# Patient Record
Sex: Female | Born: 1935 | Race: White | Hispanic: No | Smoking: Never smoker
Health system: Southern US, Community
[De-identification: ages and names within clinical notes are randomized; demographics above are authoritative.]

## PROBLEM LIST (undated history)

## (undated) DIAGNOSIS — I5032 Chronic diastolic (congestive) heart failure: Secondary | ICD-10-CM

## (undated) DIAGNOSIS — I4819 Other persistent atrial fibrillation: Principal | ICD-10-CM

## (undated) DIAGNOSIS — F329 Major depressive disorder, single episode, unspecified: Secondary | ICD-10-CM

## (undated) DIAGNOSIS — I2510 Atherosclerotic heart disease of native coronary artery without angina pectoris: Secondary | ICD-10-CM

## (undated) DIAGNOSIS — I10 Essential (primary) hypertension: Secondary | ICD-10-CM

## (undated) DIAGNOSIS — I219 Acute myocardial infarction, unspecified: Secondary | ICD-10-CM

## (undated) DIAGNOSIS — Z8679 Personal history of other diseases of the circulatory system: Secondary | ICD-10-CM

## (undated) DIAGNOSIS — T4145XA Adverse effect of unspecified anesthetic, initial encounter: Secondary | ICD-10-CM

## (undated) DIAGNOSIS — F32A Depression, unspecified: Secondary | ICD-10-CM

## (undated) DIAGNOSIS — T8859XA Other complications of anesthesia, initial encounter: Secondary | ICD-10-CM

## (undated) DIAGNOSIS — E039 Hypothyroidism, unspecified: Secondary | ICD-10-CM

## (undated) HISTORY — DX: Acute myocardial infarction, unspecified: I21.9

## (undated) HISTORY — DX: Depression, unspecified: F32.A

## (undated) HISTORY — DX: Other complications of anesthesia, initial encounter: T88.59XA

## (undated) HISTORY — PX: FRACTURE SURGERY: SHX138

## (undated) HISTORY — PX: EYE SURGERY: SHX253

## (undated) HISTORY — DX: Major depressive disorder, single episode, unspecified: F32.9

## (undated) HISTORY — PX: INNER EAR SURGERY: SHX679

## (undated) HISTORY — DX: Chronic diastolic (congestive) heart failure: I50.32

## (undated) HISTORY — PX: HERNIA REPAIR: SHX51

## (undated) HISTORY — DX: Atherosclerotic heart disease of native coronary artery without angina pectoris: I25.10

## (undated) HISTORY — DX: Personal history of other diseases of the circulatory system: Z86.79

## (undated) HISTORY — DX: Other persistent atrial fibrillation: I48.19

## (undated) HISTORY — DX: Essential (primary) hypertension: I10

## (undated) HISTORY — DX: Adverse effect of unspecified anesthetic, initial encounter: T41.45XA

## (undated) HISTORY — DX: Hypothyroidism, unspecified: E03.9

---
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
## 1986-03-28 HISTORY — PX: TONSILLECTOMY: SUR1361

## 2004-05-26 ENCOUNTER — Ambulatory Visit (HOSPITAL_COMMUNITY): Admission: RE | Admit: 2004-05-26 | Discharge: 2004-05-26 | Payer: Self-pay | Admitting: Surgery

## 2004-05-26 ENCOUNTER — Ambulatory Visit (HOSPITAL_BASED_OUTPATIENT_CLINIC_OR_DEPARTMENT_OTHER): Admission: RE | Admit: 2004-05-26 | Discharge: 2004-05-26 | Payer: Self-pay | Admitting: Surgery

## 2011-01-28 ENCOUNTER — Other Ambulatory Visit (HOSPITAL_COMMUNITY): Payer: Self-pay | Admitting: Orthopedic Surgery

## 2011-01-28 ENCOUNTER — Ambulatory Visit (HOSPITAL_COMMUNITY)
Admission: RE | Admit: 2011-01-28 | Discharge: 2011-01-28 | Disposition: A | Discharge status: Home / Self Care | Payer: Medicare Other | Source: Ambulatory Visit | Attending: Orthopedic Surgery | Admitting: Orthopedic Surgery

## 2011-01-28 ENCOUNTER — Encounter (HOSPITAL_COMMUNITY): Payer: Medicare Other

## 2011-01-28 ENCOUNTER — Encounter (HOSPITAL_COMMUNITY): Payer: Self-pay

## 2011-01-28 DIAGNOSIS — Z01818 Encounter for other preprocedural examination: Secondary | ICD-10-CM

## 2011-01-28 DIAGNOSIS — M1610 Unilateral primary osteoarthritis, unspecified hip: Secondary | ICD-10-CM | POA: Insufficient documentation

## 2011-01-28 DIAGNOSIS — M169 Osteoarthritis of hip, unspecified: Secondary | ICD-10-CM | POA: Insufficient documentation

## 2011-01-28 LAB — SURGICAL PCR SCREEN
MRSA, PCR: NEGATIVE
Staphylococcus aureus: NEGATIVE

## 2011-01-28 LAB — COMPREHENSIVE METABOLIC PANEL
ALT: 15 U/L (ref 0–35)
AST: 19 U/L (ref 0–37)
Calcium: 10.4 mg/dL (ref 8.4–10.5)
Sodium: 139 mEq/L (ref 135–145)
Total Protein: 6.9 g/dL (ref 6.0–8.3)

## 2011-01-28 LAB — URINALYSIS, ROUTINE W REFLEX MICROSCOPIC
Glucose, UA: NEGATIVE mg/dL
Ketones, ur: NEGATIVE mg/dL
Protein, ur: NEGATIVE mg/dL

## 2011-01-28 LAB — CBC
Hemoglobin: 13.6 g/dL (ref 12.0–15.0)
MCHC: 32.9 g/dL (ref 30.0–36.0)
RBC: 4.49 MIL/uL (ref 3.87–5.11)
WBC: 9.5 10*3/uL (ref 4.0–10.5)

## 2011-01-28 LAB — URINE MICROSCOPIC-ADD ON

## 2011-01-28 LAB — ABO/RH: ABO/RH(D): AB POS

## 2011-02-01 ENCOUNTER — Other Ambulatory Visit: Payer: Self-pay | Admitting: Orthopedic Surgery

## 2011-02-01 MED ORDER — BUPIVACAINE 0.25 % ON-Q PUMP SINGLE CATH 300ML: 300.0000 mL | INJECTION | Status: DC

## 2011-02-01 MED ORDER — BUPIVACAINE 0.25 % ON-Q PUMP SINGLE CATH 300ML
300.0000 mL | INJECTION | Status: DC
  Filled 2011-02-01: qty 300
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
## 2011-02-02 ENCOUNTER — Encounter (HOSPITAL_COMMUNITY): Payer: Self-pay | Admitting: *Deleted

## 2011-02-02 ENCOUNTER — Inpatient Hospital Stay (HOSPITAL_COMMUNITY)
Admission: RE | Admit: 2011-02-02 | Discharge: 2011-02-07 | DRG: 470 | Disposition: A | Discharge status: Skilled Nursing Facility | Payer: Medicare Other | Source: Ambulatory Visit | Attending: Orthopedic Surgery | Admitting: Orthopedic Surgery

## 2011-02-02 ENCOUNTER — Encounter (HOSPITAL_COMMUNITY): Payer: Self-pay | Admitting: Orthopedic Surgery

## 2011-02-02 ENCOUNTER — Inpatient Hospital Stay (HOSPITAL_COMMUNITY): Payer: Medicare Other | Admitting: *Deleted

## 2011-02-02 ENCOUNTER — Encounter (HOSPITAL_COMMUNITY)
Admission: RE | Disposition: A | Discharge status: Skilled Nursing Facility | Payer: Self-pay | Source: Ambulatory Visit | Attending: Orthopedic Surgery

## 2011-02-02 ENCOUNTER — Inpatient Hospital Stay (HOSPITAL_COMMUNITY): Payer: Medicare Other

## 2011-02-02 DIAGNOSIS — I252 Old myocardial infarction: Secondary | ICD-10-CM

## 2011-02-02 DIAGNOSIS — I10 Essential (primary) hypertension: Secondary | ICD-10-CM | POA: Diagnosis present

## 2011-02-02 DIAGNOSIS — Z01812 Encounter for preprocedural laboratory examination: Secondary | ICD-10-CM

## 2011-02-02 DIAGNOSIS — D62 Acute posthemorrhagic anemia: Secondary | ICD-10-CM | POA: Diagnosis not present

## 2011-02-02 DIAGNOSIS — E663 Overweight: Secondary | ICD-10-CM | POA: Diagnosis present

## 2011-02-02 DIAGNOSIS — M899 Disorder of bone, unspecified: Secondary | ICD-10-CM | POA: Diagnosis present

## 2011-02-02 DIAGNOSIS — I2510 Atherosclerotic heart disease of native coronary artery without angina pectoris: Secondary | ICD-10-CM | POA: Diagnosis present

## 2011-02-02 DIAGNOSIS — F3289 Other specified depressive episodes: Secondary | ICD-10-CM | POA: Diagnosis present

## 2011-02-02 DIAGNOSIS — M169 Osteoarthritis of hip, unspecified: Secondary | ICD-10-CM

## 2011-02-02 DIAGNOSIS — F329 Major depressive disorder, single episode, unspecified: Secondary | ICD-10-CM | POA: Diagnosis present

## 2011-02-02 DIAGNOSIS — M1610 Unilateral primary osteoarthritis, unspecified hip: Principal | ICD-10-CM | POA: Diagnosis present

## 2011-02-02 DIAGNOSIS — K469 Unspecified abdominal hernia without obstruction or gangrene: Secondary | ICD-10-CM | POA: Diagnosis present

## 2011-02-02 DIAGNOSIS — M949 Disorder of cartilage, unspecified: Secondary | ICD-10-CM | POA: Diagnosis present

## 2011-02-02 DIAGNOSIS — E039 Hypothyroidism, unspecified: Secondary | ICD-10-CM | POA: Diagnosis present

## 2011-02-02 DIAGNOSIS — I8390 Asymptomatic varicose veins of unspecified lower extremity: Secondary | ICD-10-CM | POA: Diagnosis present

## 2011-02-02 DIAGNOSIS — Z79899 Other long term (current) drug therapy: Secondary | ICD-10-CM

## 2011-02-02 DIAGNOSIS — Z96649 Presence of unspecified artificial hip joint: Secondary | ICD-10-CM

## 2011-02-02 DIAGNOSIS — E876 Hypokalemia: Secondary | ICD-10-CM | POA: Diagnosis not present

## 2011-02-02 DIAGNOSIS — Z6835 Body mass index (BMI) 35.0-35.9, adult: Secondary | ICD-10-CM

## 2011-02-02 DIAGNOSIS — H9190 Unspecified hearing loss, unspecified ear: Secondary | ICD-10-CM | POA: Diagnosis present

## 2011-02-02 HISTORY — PX: TOTAL HIP ARTHROPLASTY: SHX124

## 2011-02-02 LAB — TYPE AND SCREEN
ABO/RH(D): AB POS
Antibody Screen: NEGATIVE

## 2011-02-02 SURGERY — ARTHROPLASTY, HIP, TOTAL,POSTERIOR APPROACH
Anesthesia: Spinal | Site: Hip | Laterality: Right | Wound class: Clean

## 2011-02-02 MED ORDER — SODIUM CHLORIDE 0.9 % IR SOLN
Status: DC | PRN
  Administered 2011-02-02: 1000 mL

## 2011-02-02 MED ORDER — MORPHINE SULFATE 2 MG/ML IJ SOLN
1.0000 mg | INTRAMUSCULAR | Status: DC | PRN
  Administered 2011-02-02 (×2): 1 mg via INTRAVENOUS
  Filled 2011-02-02: qty 1

## 2011-02-02 MED ORDER — CEFAZOLIN SODIUM 1-5 GM-% IV SOLN
1.0000 g | Freq: Four times a day (QID) | INTRAVENOUS | Status: AC
  Administered 2011-02-02 – 2011-02-03 (×3): 1 g via INTRAVENOUS
  Filled 2011-02-02 (×3): qty 50

## 2011-02-02 MED ORDER — RIVAROXABAN 10 MG PO TABS
10.0000 mg | ORAL_TABLET | ORAL | Status: DC
  Administered 2011-02-03 – 2011-02-07 (×6): 10 mg via ORAL
  Filled 2011-02-02 (×5): qty 1

## 2011-02-02 MED ORDER — METHOCARBAMOL 500 MG PO TABS
500.0000 mg | ORAL_TABLET | Freq: Four times a day (QID) | ORAL | Status: DC | PRN
Start: 2011-02-02 — End: 2011-02-07
  Administered 2011-02-03 – 2011-02-07 (×8): 500 mg via ORAL
  Filled 2011-02-02 (×8): qty 1

## 2011-02-02 MED ORDER — OLMESARTAN MEDOXOMIL 40 MG PO TABS
40.0000 mg | ORAL_TABLET | Freq: Every day | ORAL | Status: DC
  Administered 2011-02-03 – 2011-02-07 (×6): 40 mg via ORAL
  Filled 2011-02-02 (×5): qty 1

## 2011-02-02 MED ORDER — SODIUM CHLORIDE 0.9 % IJ SOLN
INTRAMUSCULAR | Status: DC | PRN
  Administered 2011-02-02: 50 mL

## 2011-02-02 MED ORDER — POLYETHYLENE GLYCOL 3350 17 G PO PACK
17.0000 g | PACK | Freq: Every day | ORAL | Status: DC | PRN
  Administered 2011-02-04: 17 g via ORAL
  Filled 2011-02-02: qty 1

## 2011-02-02 MED ORDER — MIDAZOLAM HCL 5 MG/5ML IJ SOLN
INTRAMUSCULAR | Status: DC | PRN
  Administered 2011-02-02: 2 mg via INTRAVENOUS

## 2011-02-02 MED ORDER — ONDANSETRON HCL 4 MG/2ML IJ SOLN: 4.0000 mg | Freq: Four times a day (QID) | INTRAMUSCULAR | Status: DC | PRN

## 2011-02-02 MED ORDER — HYDROMORPHONE HCL PF 1 MG/ML IJ SOLN: 0.2500 mg | INTRAMUSCULAR | Status: DC | PRN

## 2011-02-02 MED ORDER — FLEET ENEMA 7-19 GM/118ML RE ENEM: 1.0000 | ENEMA | Freq: Every day | RECTAL | Status: DC | PRN

## 2011-02-02 MED ORDER — ATENOLOL 50 MG PO TABS
50.0000 mg | ORAL_TABLET | Freq: Every day | ORAL | Status: DC
  Administered 2011-02-03 – 2011-02-07 (×6): 50 mg via ORAL
  Filled 2011-02-02 (×5): qty 1

## 2011-02-02 MED ORDER — PHENOL 1.4 % MT LIQD: 1.0000 | OROMUCOSAL | Status: DC | PRN

## 2011-02-02 MED ORDER — MAGNESIUM HYDROXIDE 400 MG/5ML PO SUSP: 30.0000 mL | Freq: Two times a day (BID) | ORAL | Status: DC | PRN

## 2011-02-02 MED ORDER — FENTANYL CITRATE 0.05 MG/ML IJ SOLN
INTRAMUSCULAR | Status: DC | PRN
  Administered 2011-02-02: 50 ug via INTRAVENOUS

## 2011-02-02 MED ORDER — ACETAMINOPHEN 325 MG PO TABS
650.0000 mg | ORAL_TABLET | Freq: Four times a day (QID) | ORAL | Status: DC | PRN
  Administered 2011-02-05 – 2011-02-07 (×4): 650 mg via ORAL
  Filled 2011-02-02 (×4): qty 2

## 2011-02-02 MED ORDER — TEMAZEPAM 15 MG PO CAPS: 15.0000 mg | ORAL_CAPSULE | Freq: Every evening | ORAL | Status: DC | PRN

## 2011-02-02 MED ORDER — MENTHOL 3 MG MT LOZG: 1.0000 | LOZENGE | OROMUCOSAL | Status: DC | PRN

## 2011-02-02 MED ORDER — BISACODYL 5 MG PO TBEC: 10.0000 mg | DELAYED_RELEASE_TABLET | Freq: Every day | ORAL | Status: DC | PRN

## 2011-02-02 MED ORDER — PROPOFOL 10 MG/ML IV EMUL
INTRAVENOUS | Status: DC | PRN
  Administered 2011-02-02: 25 ug/kg/min via INTRAVENOUS

## 2011-02-02 MED ORDER — METHOCARBAMOL 100 MG/ML IJ SOLN
500.0000 mg | Freq: Four times a day (QID) | INTRAVENOUS | Status: DC | PRN
  Administered 2011-02-02: 500 mg via INTRAVENOUS
  Filled 2011-02-02 (×2): qty 5

## 2011-02-02 MED ORDER — BUPIVACAINE LIPOSOME 1.3 % IJ SUSP
INTRAMUSCULAR | Status: DC | PRN
  Administered 2011-02-02: 20 mL

## 2011-02-02 MED ORDER — BUPIVACAINE HCL 0.75 % IJ SOLN
INTRAMUSCULAR | Status: DC | PRN
  Administered 2011-02-02: 1.6 mL

## 2011-02-02 MED ORDER — BUPIVACAINE LIPOSOME 1.3 % IJ SUSP
20.0000 mL | INTRAMUSCULAR | Status: DC
  Filled 2011-02-02 (×2): qty 20

## 2011-02-02 MED ORDER — ACETAMINOPHEN 10 MG/ML IV SOLN
INTRAVENOUS | Status: DC | PRN
  Administered 2011-02-02: 1000 mg via INTRAVENOUS

## 2011-02-02 MED ORDER — DIPHENHYDRAMINE HCL 12.5 MG/5ML PO ELIX: 12.5000 mg | ORAL_SOLUTION | ORAL | Status: DC | PRN

## 2011-02-02 MED ORDER — DULOXETINE HCL 60 MG PO CPEP
60.0000 mg | ORAL_CAPSULE | ORAL | Status: DC
Start: 2011-02-03 — End: 2011-02-07
  Administered 2011-02-03 – 2011-02-07 (×5): 60 mg via ORAL
  Filled 2011-02-02 (×5): qty 1

## 2011-02-02 MED ORDER — LEVOTHYROXINE SODIUM 50 MCG PO TABS
50.0000 ug | ORAL_TABLET | ORAL | Status: DC
  Administered 2011-02-03 – 2011-02-07 (×5): 50 ug via ORAL
  Filled 2011-02-02 (×6): qty 1

## 2011-02-02 MED ORDER — METOCLOPRAMIDE HCL 10 MG PO TABS: 5.0000 mg | ORAL_TABLET | Freq: Three times a day (TID) | ORAL | Status: DC | PRN

## 2011-02-02 MED ORDER — ACETAMINOPHEN 650 MG RE SUPP: 650.0000 mg | Freq: Four times a day (QID) | RECTAL | Status: DC | PRN

## 2011-02-02 MED ORDER — METOCLOPRAMIDE HCL 5 MG/ML IJ SOLN
5.0000 mg | Freq: Three times a day (TID) | INTRAMUSCULAR | Status: DC | PRN
  Filled 2011-02-02: qty 2

## 2011-02-02 MED ORDER — ACETAMINOPHEN 10 MG/ML IV SOLN
1000.0000 mg | Freq: Four times a day (QID) | INTRAVENOUS | Status: AC
  Administered 2011-02-02 – 2011-02-03 (×4): 1000 mg via INTRAVENOUS
  Filled 2011-02-02 (×5): qty 100

## 2011-02-02 MED ORDER — CEFAZOLIN SODIUM 1-5 GM-% IV SOLN: 2.0000 g | Freq: Once | INTRAVENOUS | Status: DC

## 2011-02-02 MED ORDER — ONDANSETRON HCL 4 MG PO TABS: 4.0000 mg | ORAL_TABLET | Freq: Four times a day (QID) | ORAL | Status: DC | PRN

## 2011-02-02 MED ORDER — EPHEDRINE SULFATE 50 MG/ML IJ SOLN
INTRAMUSCULAR | Status: DC | PRN
  Administered 2011-02-02 (×3): 5 mg via INTRAVENOUS
  Administered 2011-02-02: 10 mg via INTRAVENOUS
  Administered 2011-02-02: 5 mg via INTRAVENOUS

## 2011-02-02 MED ORDER — BISACODYL 10 MG RE SUPP: 10.0000 mg | Freq: Every day | RECTAL | Status: DC | PRN

## 2011-02-02 MED ORDER — KCL IN DEXTROSE-NACL 20-5-0.9 MEQ/L-%-% IV SOLN
INTRAVENOUS | Status: DC
  Administered 2011-02-02: 17:00:00 via INTRAVENOUS
  Administered 2011-02-03: 1000 mL via INTRAVENOUS
  Administered 2011-02-04: via INTRAVENOUS
  Filled 2011-02-02 (×6): qty 1000

## 2011-02-02 MED ORDER — CEFAZOLIN SODIUM 1-5 GM-% IV SOLN
INTRAVENOUS | Status: DC | PRN
  Administered 2011-02-02: 2 g via INTRAVENOUS

## 2011-02-02 MED ORDER — OXYCODONE HCL 5 MG PO TABS
5.0000 mg | ORAL_TABLET | ORAL | Status: DC | PRN
  Administered 2011-02-02 – 2011-02-04 (×5): 5 mg via ORAL
  Administered 2011-02-04 (×2): 10 mg via ORAL
  Administered 2011-02-04: 5 mg via ORAL
  Administered 2011-02-05: 10 mg via ORAL
  Administered 2011-02-06 – 2011-02-07 (×2): 5 mg via ORAL
  Filled 2011-02-02 (×2): qty 2
  Filled 2011-02-02 (×6): qty 1
  Filled 2011-02-02: qty 2
  Filled 2011-02-02 (×2): qty 1

## 2011-02-02 MED ORDER — LACTATED RINGERS IV SOLN
INTRAVENOUS | Status: DC
  Administered 2011-02-02: 12:00:00 via INTRAVENOUS
  Administered 2011-02-02: 1000 mL via INTRAVENOUS
  Administered 2011-02-02 (×4): via INTRAVENOUS

## 2011-02-02 MED ORDER — MORPHINE SULFATE 2 MG/ML IJ SOLN
INTRAMUSCULAR | Status: AC
  Administered 2011-02-02: 1 mg via INTRAVENOUS
  Filled 2011-02-02: qty 1

## 2011-02-02 MED ORDER — DOCUSATE SODIUM 100 MG PO CAPS
100.0000 mg | ORAL_CAPSULE | Freq: Two times a day (BID) | ORAL | Status: DC
  Administered 2011-02-02 – 2011-02-07 (×11): 100 mg via ORAL
  Filled 2011-02-02 (×11): qty 1

## 2011-02-02 SURGICAL SUPPLY — 74 items
BAG SPEC THK2 15X12 ZIP CLS (MISCELLANEOUS) ×2
BAG ZIPLOCK 12X15 (MISCELLANEOUS) ×4 IMPLANT
BALL HIP 32MM PLUS 3 (Hips) IMPLANT
BANDAGE ELASTIC 6 VELCRO ST LF (GAUZE/BANDAGES/DRESSINGS) ×1 IMPLANT
BANDAGE ESMARK 6X9 LF (GAUZE/BANDAGES/DRESSINGS) ×1 IMPLANT
BIT DRILL 2.8X128 (BIT) ×2 IMPLANT
BLADE EXTENDED COATED 6.5IN (ELECTRODE) ×2 IMPLANT
BLADE SAG 18X100X1.27 (BLADE) ×1 IMPLANT
BLADE SAW SAG 73X25 THK (BLADE) ×1
BLADE SAW SGTL 11.0X1.19X90.0M (BLADE) ×1 IMPLANT
BLADE SAW SGTL 73X25 THK (BLADE) ×1 IMPLANT
BNDG CMPR 9X6 STRL LF SNTH (GAUZE/BANDAGES/DRESSINGS)
BNDG ESMARK 6X9 LF (GAUZE/BANDAGES/DRESSINGS)
BOWL SMART MIX CTS (DISPOSABLE) ×1 IMPLANT
CATH KIT ON-Q SILVERSOAK 5 (CATHETERS) ×1 IMPLANT
CATH KIT ON-Q SILVERSOAK 5IN (CATHETERS) IMPLANT
CLOTH BEACON ORANGE TIMEOUT ST (SAFETY) ×3 IMPLANT
CUFF TOURN SGL QUICK 34 (TOURNIQUET CUFF)
CUFF TRNQT CYL 34X4X40X1 (TOURNIQUET CUFF) ×1 IMPLANT
CUP ACETBLR 52 OD PINNACLE (Hips) ×1 IMPLANT
DRAPE EXTREMITY T 121X128X90 (DRAPE) ×1 IMPLANT
DRAPE INCISE IOBAN 66X45 STRL (DRAPES) ×2 IMPLANT
DRAPE ORTHO SPLIT 77X108 STRL (DRAPES) ×4
DRAPE POUCH INSTRU U-SHP 10X18 (DRAPES) ×3 IMPLANT
DRAPE SURG ORHT 6 SPLT 77X108 (DRAPES) ×2 IMPLANT
DRAPE U-SHAPE 47X51 STRL (DRAPES) ×3 IMPLANT
DRESSING ALLEVYN BORDER HEEL (GAUZE/BANDAGES/DRESSINGS) ×1 IMPLANT
DRSG ADAPTIC 3X8 NADH LF (GAUZE/BANDAGES/DRESSINGS) ×2 IMPLANT
DRSG MEPILEX BORDER 4X4 (GAUZE/BANDAGES/DRESSINGS) ×3 IMPLANT
DURAPREP 26ML APPLICATOR (WOUND CARE) ×3 IMPLANT
ELECT REM PT RETURN 9FT ADLT (ELECTROSURGICAL) ×2
ELECTRODE REM PT RTRN 9FT ADLT (ELECTROSURGICAL) ×2 IMPLANT
ELIMINATOR HOLE APEX DEPUY (Hips) ×1 IMPLANT
EVACUATOR 1/8 PVC DRAIN (DRAIN) ×3 IMPLANT
FACESHIELD LNG OPTICON STERILE (SAFETY) ×13 IMPLANT
GLOVE BIO SURGEON STRL SZ7.5 (GLOVE) ×3 IMPLANT
GLOVE BIO SURGEON STRL SZ8 (GLOVE) ×3 IMPLANT
GLOVE BIOGEL PI IND STRL 8 (GLOVE) ×4 IMPLANT
GLOVE BIOGEL PI INDICATOR 8 (GLOVE) ×2
GOWN PREVENTION PLUS XLARGE (GOWN DISPOSABLE) ×2 IMPLANT
GOWN STRL REIN XL XLG (GOWN DISPOSABLE) ×3 IMPLANT
HANDPIECE INTERPULSE COAX TIP (DISPOSABLE)
HIP BALL 32MM PLUS 3 (Hips) ×2 IMPLANT
IMMOBILIZER KNEE 20 (SOFTGOODS) ×2
IMMOBILIZER KNEE 20 THIGH 36 (SOFTGOODS) ×1 IMPLANT
KIT BASIN OR (CUSTOM PROCEDURE TRAY) ×3 IMPLANT
LINER MARATHON NEUT +4X52X32 (Hips) ×1 IMPLANT
MANIFOLD NEPTUNE II (INSTRUMENTS) ×3 IMPLANT
NS IRRIG 1000ML POUR BTL (IV SOLUTION) ×3 IMPLANT
PACK TOTAL JOINT (CUSTOM PROCEDURE TRAY) ×3 IMPLANT
PAD ABD 7.5X8 STRL (GAUZE/BANDAGES/DRESSINGS) ×1 IMPLANT
PADDING CAST COTTON 6X4 STRL (CAST SUPPLIES) ×3 IMPLANT
PASSER SUT SWANSON 36MM LOOP (INSTRUMENTS) ×2 IMPLANT
POSITIONER SURGICAL ARM (MISCELLANEOUS) ×3 IMPLANT
SCREW 6.5MMX25MM (Screw) ×1 IMPLANT
SCREW 6.5MMX30MM (Screw) ×1 IMPLANT
SET HNDPC FAN SPRY TIP SCT (DISPOSABLE) ×1 IMPLANT
SLEEVE FEM PROX 20D SML (Hips) ×1 IMPLANT
SPONGE GAUZE 4X4 12PLY (GAUZE/BANDAGES/DRESSINGS) ×3 IMPLANT
SPONGE GAUZE 4X4 FOR O.R. (GAUZE/BANDAGES/DRESSINGS) ×1 IMPLANT
SROM FEM STEM (Hips) ×1 IMPLANT
STRIP CLOSURE SKIN 1/2X4 (GAUZE/BANDAGES/DRESSINGS) ×6 IMPLANT
SUCTION FRAZIER 12FR DISP (SUCTIONS) ×2 IMPLANT
SUT ETHIBOND NAB CT1 #1 30IN (SUTURE) ×4 IMPLANT
SUT MNCRL AB 4-0 PS2 18 (SUTURE) ×3 IMPLANT
SUT PDS AB 1 CT1 27 (SUTURE) ×3 IMPLANT
SUT VIC AB 1 CT1 27 (SUTURE) ×6
SUT VIC AB 1 CT1 27XBRD ANTBC (SUTURE) ×3 IMPLANT
SUT VIC AB 2-0 CT1 27 (SUTURE) ×6
SUT VIC AB 2-0 CT1 TAPERPNT 27 (SUTURE) ×6 IMPLANT
TOWEL OR 17X26 10 PK STRL BLUE (TOWEL DISPOSABLE) ×6 IMPLANT
TRAY FOLEY CATH 14FRSI W/METER (CATHETERS) ×3 IMPLANT
WATER STERILE IRR 1500ML POUR (IV SOLUTION) ×3 IMPLANT
WRAP KNEE MAXI GEL POST OP (GAUZE/BANDAGES/DRESSINGS) ×1 IMPLANT

## 2011-02-02 NOTE — Preoperative (Signed)
Beta Blockers   Reason not to administer Beta Blockers:Took atenolol 50 mg on 02/02/2011 at 06:30

## 2011-02-02 NOTE — Anesthesia Postprocedure Evaluation (Signed)
  Anesthesia Post-op Note  Patient: Amy Chapman  Procedure(s) Performed:  TOTAL HIP ARTHROPLASTY  Patient Location: PACU  Anesthesia Type: Spinal  Level of Consciousness: awake and alert   Airway and Oxygen Therapy: Patient Spontanous Breathing  Post-op Pain: mild  Post-op Assessment: Post-op Vital signs reviewed, Patient's Cardiovascular Status Stable, Respiratory Function Stable, Patent Airway and No signs of Nausea or vomiting  Post-op Vital Signs: stable  Complications: No apparent anesthesia complications;moving both feet

## 2011-02-02 NOTE — Op Note (Signed)
Pre-operative diagnosis- Osteoarthritis Right hip  Post-operative diagnosis- Osteoarthritis  Right hip  Procedure-  RightTotal Hip Arthroplasty  Surgeon- Gus Rankin. Logan Vegh, MD  Assistant- Avel Peace, PA-C   Anesthesia  Spinal  EBL- 150 ml   Drain- None   Complication- None  Condition- Stable to PACU  Brief Clinical Note-  Amy Chapman is a 77 y.o. female with end stage arthritis of her right hip with progressively worsening pain and dysfunction. Pain occurs with activity and rest including pain at night. She has tried analgesics, protected weight bearing and rest without benefit. Pain is too severe to attempt physical therapy. Radiographs demonstrate bone on bone arthritis with subchondral cyst formation.   Procedure in detail-   The patient is brought into the operating room and placed on the operating table. After successful administration of Spinal   anesthesia, the patient is placed in the  Left lateral decubitus position with the  Right side up and held in place with the hip positioner. The lower extremity is isolated from the perineum with plastic drapes and time-out is performed by the surgical team. The lower extremity is then prepped and draped in the usual sterile fashion. A short posterolateral incision is made with a ten blade through the subcutaneous tissue to the level of the fascia lata which is incised in line with the skin incision. The sciatic nerve is palpated and protected and the short external rotators and capsule are isolated from the femur. The hip is then dislocated and the center of the femoral head is marked. A trial prosthesis is placed such that the trial head corresponds to the center of the patients' native femoral head. The resection level is marked on the femoral neck and the resection is made with an oscillating saw. The femoral head is removed and femoral retractors placed to gain access to the femoral canal.      The canal finder is passed into the  femoral canal and the canal is thoroughly irrigated with sterile saline to remove the fatty contents. Axial reaming is performed to 15.5  mm, proximal reaming to 20D   and the sleeve machined to a large. A 20D large trial sleeve is placed into the proximal femur.      The femur is then retracted anteriorly to gain acetabular exposure. Acetabular retractors are placed and the labrum and osteophytes are removed, Acetabular reaming is performed to 51  mm and a 52  mm Pinnacle acetabular shell is placed in anatomic position with excellent purchase. Additional dome screws were placed. An apex hole eliminator is placed and the permanent 32 mm neutral plus4 Marathon liner is placed into the acetabular shell.      The trial femur is then placed into the femoral canal. The size is 20 x 15  stem with a 36 +8  neck and a 32 + 3 head with the neck version matching  the patients' native anteversion. The hip is reduced with excellent stability with full extension and full external rotation, 70 degrees flexion with 40 degrees adduction and 90 degrees internal rotation and 90 degrees of flexion with 70 degrees of internal rotation. The operative leg is placed on top of the non-operative leg and the leg lengths are found to be equal. The trials are then removed and the permanent implant of the same size is impacted into the femoral canal. The metal  femoral head of the same size as the trial is placed and the hip is reduced with the same  stability parameters. The operative leg is again placed on top of the non-operative leg and the leg lengths are found to be equal.      The wound is then copiously irrigated with saline solution and the capsule and short external rotators are re-attached to the femur through drill holes with Ethibond suture. The fascia lata is closed over a hemovac drain with #1 vicryl suture and the fascia lata, gluteal muscles and subcutaneous tissues are injected with Exparel 20ml diluted with saline 50ml. The  subcutaneous tissues are closed with #1 and2-0 vicryl and the subcuticular layer closed with running 4-0 Monocryl. The drain is hooked to suction, incision cleaned and dried, and steri-srips and a bulky sterile dressing applied. The limb is placed into a knee immobilizer and the patient is awakened and transported to recovery in stable condition.      Please note that a surgical assistant was a medical necessity for this procedure in order to perform it in a safe and expeditious manner. The assistant was necessary to provide retraction to the vital neurovascular structures and to retract and position the limb to allow for anatomic placement of the prosthetic components.  Gus Rankin Denna Fryberger, MD    02/02/2011, 12:38 PM
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
## 2011-02-02 NOTE — Anesthesia Procedure Notes (Addendum)
Spinal  Staffing Anesthesiologist: Azell Der Preanesthetic Checklist Completed: patient identified, site marked, surgical consent, pre-op evaluation, timeout performed, IV checked, risks and benefits discussed and monitors and equipment checked Spinal Block Patient position: sitting Prep: Betadine Patient monitoring: heart rate, cardiac monitor, continuous pulse ox and blood pressure Approach: midline Location: L3-4 Injection technique: single-shot Needle Needle type: Quincke  Needle gauge: 22 G Needle length: 5 cm Additional Notes Lot [id number]; expires 2013-11

## 2011-02-02 NOTE — Anesthesia Preprocedure Evaluation (Addendum)
Anesthesia Evaluation  Patient identified by MRN, date of birth, ID band Patient awake    Reviewed: Allergy & Precautions, H&P , NPO status , Patient's Chart, lab work & pertinent test results, reviewed documented beta blocker date and time   Airway Mallampati: II TM Distance: >3 FB Neck ROM: full    Dental No notable dental hx. (+) Edentulous Upper and Edentulous Lower   Pulmonary neg pulmonary ROS,  clear to auscultation  Pulmonary exam normal       Cardiovascular Exercise Tolerance: Good hypertension, On Home Beta Blockers neg cardio ROS regular Normal    Neuro/Psych Negative Neurological ROS  Negative Psych ROS   GI/Hepatic negative GI ROS, Neg liver ROS,   Endo/Other  Negative Endocrine ROS  Renal/GU negative Renal ROS  Genitourinary negative   Musculoskeletal   Abdominal   Peds  Hematology negative hematology ROS (+)   Anesthesia Other Findings   Reproductive/Obstetrics negative OB ROS                           Anesthesia Physical Anesthesia Plan  ASA: III  Anesthesia Plan: Spinal   Post-op Pain Management:    Induction:   Airway Management Planned:   Additional Equipment:   Intra-op Plan:   Post-operative Plan:   Informed Consent: I have reviewed the patients History and Physical, chart, labs and discussed the procedure including the risks, benefits and alternatives for the proposed anesthesia with the patient or authorized representative who has indicated his/her understanding and acceptance.     Plan Discussed with: CRNA  Anesthesia Plan Comments:        Anesthesia Quick Evaluation

## 2011-02-02 NOTE — Progress Notes (Signed)
Utilization review completed.  

## 2011-02-02 NOTE — Transfer of Care (Signed)
Immediate Anesthesia Transfer of Care Note  Patient: [name]  Procedure(s) Performed:  TOTAL HIP ARTHROPLASTY  Patient Location: PACU  Anesthesia Type: Regional  Level of Consciousness: awake, alert  and sedated  Airway & Oxygen Therapy: Patient Spontanous Breathing and Patient connected to face mask oxygen  Post-op Assessment: Report given to PACU RN and Post -op Vital signs reviewed and stable  Post vital signs: Reviewed and stable  Complications: No apparent anesthesia complications

## 2011-02-03 DIAGNOSIS — M169 Osteoarthritis of hip, unspecified: Secondary | ICD-10-CM

## 2011-02-03 LAB — CBC
HCT: 34.4 % — ABNORMAL LOW (ref 36.0–46.0)
Hemoglobin: 11.4 g/dL — ABNORMAL LOW (ref 12.0–15.0)
MCH: 30.9 pg (ref 26.0–34.0)
MCHC: 33.1 g/dL (ref 30.0–36.0)
MCV: 93.2 fL (ref 78.0–100.0)
RDW: 13.3 % (ref 11.5–15.5)

## 2011-02-03 LAB — BASIC METABOLIC PANEL
BUN: 6 mg/dL (ref 6–23)
Calcium: 9 mg/dL (ref 8.4–10.5)
Creatinine, Ser: 0.46 mg/dL — ABNORMAL LOW (ref 0.50–1.10)
GFR calc Af Amer: >90 mL/min (ref >90)
GFR calc non Af Amer: >90 mL/min (ref >90)
Glucose, Bld: 114 mg/dL — ABNORMAL HIGH (ref 70–99)
Potassium: 3.8 mEq/L (ref 3.5–5.1)

## 2011-02-03 NOTE — Progress Notes (Signed)
Subjective: 1 Day Post-Op Procedure(s) (LRB): TOTAL HIP ARTHROPLASTY (Right) Patient reports pain as moderate.   Patient seen in rounds with Dr. Lequita Halt. Patient has complaints of postop pain but doing a little better this morning.  Will start therapy.  Objective: Vital signs in last 24 hours: Temp:  [97.3 F (36.3 C)-99.6 F (37.6 C)] 97.9 F (36.6 C) (11/08 0536) Pulse Rate:  [56-80] 80  (11/08 0536) Resp:  [14-20] 16  (11/08 0536) BP: (93-145)/(50-87) 124/68 mmHg (11/08 0536) SpO2:  [93 %-100 %] 100 % (11/08 0536)  Intake/Output from previous day:  Intake/Output Summary (Last 24 hours) at 02/03/11 0708 Last data filed at 02/03/11 1610  Gross per 24 hour  Intake   5955 ml  Output   2250 ml  Net   3705 ml    Intake/Output this shift:    No results found for this basename: HGB:5 in the last 72 hours No results found for this basename: WBC:2,RBC:2,HCT:2,PLT:2 in the last 72 hours No results found for this basename: NA:2,K:2,CL:2,CO2:2,BUN:2,CREATININE:2,GLUCOSE:2,CALCIUM:2 in the last 72 hours No results found for this basename: LABPT:2,INR:2 in the last 72 hours  Exam - Neurovascular intact Sensation intact distally Intact pulses distally Dressing - clean, dry Motor function intact - moving foot and toes well on exam.    Assessment/Plan: 1 Day Post-Op Procedure(s) (LRB): TOTAL HIP ARTHROPLASTY (Right)  Advance diet Up with therapy Discharge to SNF when met goals  Past Medical History  Diagnosis Date  . Coronary artery disease     hx of blockage-but her system re-vasculized and no stent - per hearrt cath 12 yrs ago  . Myocardial infarction     no idea--per muga scan yrs ago  . Hypothyroidism   . Depression     w/c bound for past yr--cymbalta helping and also helps leg pain  . 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

## 2011-02-03 NOTE — Progress Notes (Signed)
Physical Therapy Treatment Patient Details Name: Amy Chapman MRN: [id number] DOB: 17-Nov-1928 Today's Date: 02/03/2011 1400 - 1430  GT. TA PT Assessment/Plan  PT - Assessment/Plan PT Frequency: 7X/week Follow Up Recommendations: Skilled nursing facility Equipment Recommended: Defer to next venue PT Goals  Acute Rehab PT Goals PT Goal: Supine/Side to Sit - Progress: Not met PT Goal: Sit to Supine/Side - Progress: Not met PT Goal: Stand - Progress: Not met PT Goal: Ambulate - Progress: Not met  PT Treatment Precautions/Restrictions  Precautions Precautions: Posterior Hip Precaution Booklet Issued: Yes (comment) Precaution Comments: sign posted in room Restrictions Weight Bearing Restrictions: Yes RLE Weight Bearing: Partial weight bearing RLE Partial Weight Bearing Percentage or Pounds: 25-50% Other Position/Activity Restrictions: Post THP Mobility (including Balance) Bed Mobility Bed Mobility: Yes Sit to Supine - Right: 1: +2 Total assist Sit to Supine - Right Details (indicate cue type and reason): pt 50% Transfers Transfers: Yes Sit to Stand: 1: +2 Total assist;From chair/3-in-1 Sit to Stand Details (indicate cue type and reason): pt 70%  with cues for use of UEs and LE position Stand to Sit: 1: +2 Total assist;With armrests;To bed;To chair/3-in-1 Stand to Sit Details: pt 70% Ambulation/Gait Ambulation/Gait: Yes Ambulation/Gait Assistance: 1: +2 Total assist Ambulation/Gait Assistance Details (indicate cue type and reason): pt 60% with cues for posture, sequence and position from RW Ambulation Distance (Feet):  (10', 2', 3') Assistive device: Rolling walker Gait Pattern: Step-to pattern;Decreased stride length;Shuffle    Exercise    End of Session PT - End of Session Equipment Utilized During Treatment: Gait belt Activity Tolerance: Patient limited by fatigue Patient left: in bed General Behavior During Session: [facility] for tasks performed Cognition: [facility] for  tasks performed  Eaden Hettinger 02/03/2011, 2:56 PM
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
## 2011-02-03 NOTE — Progress Notes (Signed)
CSW has confirmed with [facility] that Amy Chapman can transfer to their facility upon discharge from the hospital. [facility] is aware that discharge may be over the weekend and may be able to accept her then. CSW will follow-up with [facility] tomorrow to determine if weekend transfer will be possible. [facility] will need the discharge summary and medication list on Friday to prepare for a weekend transfer. CSW assessment is located in shadow chart. FL2 has been completed and placed in shadow chart for MD signature. Kingsley Spittle, LCSW Clinical Social Worker [phone].

## 2011-02-04 LAB — BASIC METABOLIC PANEL
Calcium: 9.1 mg/dL (ref 8.4–10.5)
GFR calc Af Amer: >90 mL/min (ref >90)
GFR calc non Af Amer: >90 mL/min (ref >90)
Glucose, Bld: 122 mg/dL — ABNORMAL HIGH (ref 70–99)
Potassium: 3.4 mEq/L — ABNORMAL LOW (ref 3.5–5.1)
Sodium: 136 mEq/L (ref 135–145)

## 2011-02-04 LAB — CBC
MCH: 30.6 pg (ref 26.0–34.0)
MCHC: 32.9 g/dL (ref 30.0–36.0)
Platelets: 214 10*3/uL (ref 150–400)
RDW: 13.1 % (ref 11.5–15.5)

## 2011-02-04 MED ORDER — METHOCARBAMOL 500 MG PO TABS: 500.0000 mg | ORAL_TABLET | Freq: Four times a day (QID) | ORAL | Status: AC | PRN

## 2011-02-04 MED ORDER — POLYSACCHARIDE IRON 150 MG PO CAPS
150.0000 mg | ORAL_CAPSULE | Freq: Every day | ORAL | Status: DC
  Administered 2011-02-04 – 2011-02-07 (×5): 150 mg via ORAL
  Filled 2011-02-04 (×4): qty 1

## 2011-02-04 MED ORDER — OXYCODONE HCL 5 MG PO TABS: 5.0000 mg | ORAL_TABLET | ORAL | Status: AC | PRN

## 2011-02-04 NOTE — Progress Notes (Signed)
Physical Therapy Treatment Patient Details Name: Amy Chapman MRN: [id number] DOB: 12/12/1928 Today's Date: 02/04/2011 0935 - 1011, GT, TE PT Assessment/Plan  PT - Assessment/Plan Comments on Treatment Session: Pt limited by increased pain vs last day - pt premedicated PT Plan: Discharge plan remains appropriate PT Frequency: 7X/week Follow Up Recommendations: Skilled nursing facility Equipment Recommended: Defer to next venue PT Goals  Acute Rehab PT Goals PT Goal: Supine/Side to Sit - Progress: Not met PT Goal: Sit to Supine/Side - Progress: Not met PT Goal: Stand - Progress: Not met PT Goal: Ambulate - Progress: Not met  PT Treatment Precautions/Restrictions  Precautions Precautions: Posterior Hip Precaution Booklet Issued: Yes (comment) Precaution Comments: Pt recalls 2/3 THP and PWB status without cues Restrictions Weight Bearing Restrictions: Yes RLE Weight Bearing: Partial weight bearing RLE Partial Weight Bearing Percentage or Pounds: 25-50% Other Position/Activity Restrictions: Post THP Mobility (including Balance) Bed Mobility Bed Mobility: Yes Supine to Sit: 1: +2 Total assist Supine to Sit Details (indicate cue type and reason): pt 60% Transfers Transfers: Yes Sit to Stand: 1: +2 Total assist Sit to Stand Details (indicate cue type and reason): pt 60% with cues for use of UEs and for LE position Stand to Sit: 1: +2 Total assist Stand to Sit Details: pt 60% with cues Ambulation/Gait Ambulation/Gait: Yes Ambulation/Gait Assistance: 1: +2 Total assist Ambulation/Gait Assistance Details (indicate cue type and reason): pt 60% with cues for sequence, posture, increased R LE WB, increased UE WB , posture and position from RW Ambulation Distance (Feet): 5 Feet Assistive device: Rolling walker Gait Pattern: Step-to pattern    Exercise  Total Joint Exercises Ankle Circles/Pumps: AROM;15 reps;Supine Gluteal Sets: AROM;Both;10 reps;Supine Short Arc Quad:  AROM;10 reps;Supine Heel Slides: AAROM;Right;20 reps;Supine Hip ABduction/ADduction: AAROM;Right;20 reps;Supine End of Session PT - End of Session Activity Tolerance: Patient limited by pain Patient left: in chair Nurse Communication: Mobility status for transfers;Mobility status for ambulation;Other (comment) (Pt pain level) General Behavior During Session: [facility] for tasks performed Cognition: [facility] for tasks performed  Amy Chapman 02/04/2011, 12:57 PM

## 2011-02-04 NOTE — Progress Notes (Signed)
Subjective: 2 Days Post-Op Procedure(s) (LRB): TOTAL HIP ARTHROPLASTY (Right) Patient reports pain as mild.   Patient seen in rounds with Dr. Lequita Halt. Patient has no obvious complaints.  Daughter in room with her.  She states she is feeling great today.  She walked a short distance yesterday and will continue to work with PT today. The plan is to go the [facility].  Staff stated the bed will be ready tomorrow.  Will set up for transfer .   Objective: Vital signs in last 24 hours: Temp:  [97.6 F (36.4 C)-98.3 F (36.8 C)] 97.6 F (36.4 C) (11/09 0600) Pulse Rate:  [82-91] 86  (11/09 0600) Resp:  [16-22] 22  (11/09 0600) BP: (122-153)/(57-73) 153/67 mmHg (11/09 0600) SpO2:  [93 %-96 %] 93 % (11/09 0600) Weight:  [89.812 kg (198 lb)] 198 lb (89.812 kg) (11/08 1420)  Intake/Output from previous day:  Intake/Output Summary (Last 24 hours) at 02/04/11 1258 Last data filed at 02/04/11 1100  Gross per 24 hour  Intake 2812.5 ml  Output   2000 ml  Net  812.5 ml    Intake/Output this shift: Total I/O In: 360 [P.O.:360] Out: 300 [Urine:300]   Basename 02/04/11 0447 02/03/11 0710  HGB 9.9* 11.4*    Basename 02/04/11 0447 02/03/11 0710  WBC 10.3 9.4  RBC 3.24* 3.69*  HCT 30.1* 34.4*  PLT 214 238    Basename 02/04/11 0447 02/03/11 0710  NA 136 137  K 3.4* 3.8  CL 107 104  CO2 22 27  BUN 7 6  CREATININE 0.39* 0.46*  GLUCOSE 122* 114*  CALCIUM 9.1 9.0    Exam - Neurovascular intact Sensation intact distally Intact pulses distally Dressing/Incision - clean, dry, no drainage Motor function intact - moving foot and toes well on exam.   Assessment/Plan: 2 Days Post-Op Procedure(s) (LRB): TOTAL HIP ARTHROPLASTY (Right)  Advance diet Up with therapy Discharge to SNF tomorrow at [facility]  Past Medical History  Diagnosis Date  . Coronary artery disease     hx of blockage-but her system re-vasculized and no stent - per hearrt cath 12 yrs ago    . Myocardial infarction     no idea--per muga scan yrs ago  . Hypothyroidism   . Depression     w/c bound for past yr--cymbalta helping and also helps leg pain  . 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  [name] 02/04/2011, 12:58 PM

## 2011-02-04 NOTE — Progress Notes (Signed)
Physical Therapy Treatment Patient Details Name: Amy Chapman MRN: [id number] DOB: 12-Jul-1928 Today's Date: 02/04/2011 1446 - 1507  TA PT Assessment/Plan  PT - Assessment/Plan Comments on Treatment Session:  (Pt ltd by pain with WB) PT Plan: Discharge plan remains appropriate PT Frequency: 7X/week Follow Up Recommendations: Skilled nursing facility Equipment Recommended: Defer to next venue PT Goals  Acute Rehab PT Goals PT Goal: Supine/Side to Sit - Progress: Not met PT Goal: Sit to Supine/Side - Progress: Not met PT Goal: Stand - Progress: Not met PT Goal: Ambulate - Progress: Not met  PT Treatment Precautions/Restrictions  Precautions Precautions: Posterior Hip Precaution Booklet Issued: Yes (comment) Precaution Comments: Pt recalls 2/3 THP and PWB status without cues Restrictions Weight Bearing Restrictions: Yes RLE Weight Bearing: Partial weight bearing RLE Partial Weight Bearing Percentage or Pounds: 25-50% Other Position/Activity Restrictions: Post THP Mobility (including Balance) Bed Mobility Bed Mobility: Yes Sit to Supine - Right: 1: +2 Total assist Sit to Supine - Right Details (indicate cue type and reason): pt 10% Transfers Transfers: Yes Sit to Stand: 1: +2 Total assist Sit to Stand Details (indicate cue type and reason): pt 60% Stand to Sit: 1: +2 Total assist Stand to Sit Details: pt 60% Stand Pivot Transfers: 1: +2 Total assist Stand Pivot Transfer Details (indicate cue type and reason): pt 60% with cues for increased WB R LE and Bil Ues, sequence, and position from RW Ambulation/Gait Ambulation/Gait: Yes Ambulation/Gait Assistance: 1: +2 Total assist Ambulation/Gait Assistance Details (indicate cue type and reason): 60% with cues for UE and R LE WB, posture, position from RW, sequence, and increased R heel contact Ambulation Distance (Feet): 3 Feet Assistive device: Rolling walker Gait Pattern: Step-to pattern    Exercise    End of Session PT  - End of Session Activity Tolerance: Patient limited by pain Patient left: in bed General Behavior During Session: [facility] for tasks performed Cognition: [facility] for tasks performed  Wess Baney 02/04/2011, 3:15 PM

## 2011-02-04 NOTE — Discharge Summary (Signed)
Physician Discharge Summary   Patient ID: Amy Chapman MRN: [id number] DOB/AGE: 1928/10/21 77 y.o.  Admit date: 02/02/2011 Discharge date: 02/05/2011  Primary Diagnosis: Osteoarthritis, Hip (715.35)  Impression: Right Hip OA  Admission Diagnoses: Impaired Hearing  Depression  Coronary Artery Disease/Heart Disease  Varicose veins  Abdominal hernia (553.9)  Hypothyroidism  Osteopenia  Hypothyroidism  Myocardial infarction Osteoarthritis, Hip   Discharge Diagnoses:  Principal Problem:  *Osteoarthritis of hip S/P Right Total Hip Postop Acute Blood Loss Anemia Mild Postop Hypokalemia   Procedure: Procedure(s) (LRB): TOTAL HIP ARTHROPLASTY (Right)   Consults: none  HPI:  Amy Chapman is a 77 y.o. female with end stage arthritis of her right hip with progressively worsening pain and dysfunction. Pain occurs with activity and rest including pain at night. She has tried analgesics, protected weight bearing and rest without benefit. Pain is too severe to attempt physical therapy. Radiographs demonstrate bone on bone arthritis with subchondral cyst formation.     Laboratory Data: [facility]  02/04/11 0447  02/03/11 0710   HGB  9.9*  11.4*     Basename  02/04/11 0447  02/03/11 0710   WBC  10.3  9.4   RBC  3.24*  3.69*   HCT  30.1*  34.4*   PLT  214  238     Basename  02/04/11 0447  02/03/11 0710   NA  136  137   K  3.4*  3.8   CL  107  104   CO2  22  27   BUN  7  6   CREATININE  0.39*  0.46*   GLUCOSE  122*  114*   CALCIUM  9.1  9.0      X-Rays:Dg Hip Complete Right  01/28/2011  *RADIOLOGY REPORT*  Clinical Data: Preoperative evaluation prior right hip arthroplasty for osteoarthritis.  RIGHT HIP - COMPLETE 2+ VIEW 01/28/2011:  Comparison: None.  Findings: Complete loss of the joint space in the right hip with hypertrophic spurring involving the femoral head and the acetabular roof.  No evidence of acute, subacute, or healed fractures. Relatively  well-preserved bone mineral density for age.  Included AP pelvis demonstrates moderate inferolateral joint space narrowing in the contralateral left hip.  Sacroiliac joints and symphysis pubis intact.  Severe degenerative changes involving the visualized lumbar spine.  IMPRESSION: Severe osteoarthritis in the right hip with complete loss of the joint space and hypertrophic spurring involving the femoral head and acetabular roof.  Original Report Authenticated By: Arnell Sieving, M.D.   Dg Pelvis Portable To Be Done In Pacu  02/02/2011  *RADIOLOGY REPORT*  Clinical Data: Right hip replacement surgery.  PORTABLE PELVIS  Comparison: None  Findings: The femoral and acetabular components are well seated. No complicating features are demonstrated.  Advanced degenerative changes involving the left hip are noted.  IMPRESSION: Well seated components of a total right hip arthroplasty.  No complicating features.  Original Report Authenticated By: P. Loralie Champagne, M.D.    EKG:No orders found for this or any previous visit.    Hospital Course: Patient was admitted to [facility] and taken to the OR and underwent the above state procedure without complications.  Patient tolerated the procedure well and was later transferred to the recovery room and then to the orthopaedic floor for postoperative care.  They were given PO and IV analgesics for pain control following their surgery.  They were given 24 hours of postoperative antibiotics and started on DVT prophylaxis.   PT and  OT were ordered for total joint protocol.  Discharge planning consulted to help with postop disposition and equipment needs.  Patient had a rough night on the evening of surgery and started to get up with therapy on day one.  She was feeling a little better on the next day and started to get up with PT PWB 25-50%. Encouraged PO meds and had IV push backup. Continued to progress with therapy into day two.  Dressing was changed on day two  and the incision was healing well. By that afternoon, it was felt that she would be ready for transfer over the weekend.  We got Social Worker involved right after surgery since the patient wanted to look into SNF at [facility].  The staff informed Dr. Lequita Halt that the bed would be available on Saturday so we would make arrangements to transfer over that day.   Discharge Medications:   atenolol (TENORMIN) tablet 50 mg Dose: 50 mg Freq: Daily before lunch Route: PO  DULoxetine (CYMBALTA) DR capsule 60 mg Dose: 60 mg Freq: Every morning Route: PO  levothyroxine (SYNTHROID, LEVOTHROID) tablet 50 mcg Dose: 50 mcg Freq: Every morning Route: PO  olmesartan (BENICAR) tablet 40 mg Dose: 40 mg Freq: Daily before lunch Route: PO  docusate sodium (COLACE) capsule 100 mg Dose: 100 mg Freq: 2 times daily Route: PO  polysaccharide iron (NIFEREX) capsule 150 mg Dose: 150 mg Freq: Daily Route: PO for three weeks then discontinue  rivaroxaban (XARELTO) tablet 10 mg Dose: 10 mg Freq: Every 24 hours Route: PO for 18 more days then discontinue  acetaminophen (TYLENOL) tablet 650 mg Dose: 650 mg Freq: Every 6 hours PRN Route: PO PRN Reason: mild pain PRN Comment: or Fever >/= 101  bisacodyl (DULCOLAX) EC tablet 10 mg Dose: 10 mg Freq: Daily PRN Route: PO PRN Reason: constipation  bisacodyl (DULCOLAX) suppository 10 mg Dose: 10 mg Freq: Daily PRN Route: RE PRN Reason: constipation PRN Comment: constipation  diphenhydrAMINE (BENADRYL) 12.5 MG/5ML elixir 12.5-25 mg Dose: 12.5-25 mg Freq: Every 4 hours PRN Route: PO PRN Reason: itching  magnesium hydroxide (MILK OF MAGNESIA) suspension 30 mL Dose: 30 mL Freq: Every 12 hours PRN Route: PO PRN Reason: constipation   methocarbamol (ROBAXIN) tablet 500 mg Dose: 500 mg Freq: Every 6 hours PRN Route: PO PRN Reason: muscle spasms   metoCLOPramide (REGLAN) tablet 5-10 mg Dose: 5-10 mg Freq: Every 8 hours PRN Route: PO PRN Reason:  nausea  ondansetron (ZOFRAN) tablet 4 mg Dose: 4 mg Freq: Every 6 hours PRN Route: PO PRN Reason: nausea   oxyCODONE (Oxy IR/ROXICODONE) immediate release tablet 5-10 mg Dose: 5-10 mg Freq: Every 4 hours PRN Route: PO PRN Reason: breakthrough pain   polyethylene glycol (MIRALAX / GLYCOLAX) packet 17 g Dose: 17 g Freq: Daily PRN Route: PO PRN Reason: constipation  temazepam (RESTORIL) capsule 15-30 mg Dose: 15-30 mg Freq: At bedtime PRN Route: PO PRN Reason: sleep  Diet: Diabetic  Activity:PWB   Follow-up:in 2 weeks  Disposition: [facility]  Discharged Condition: stable       Signed: [name] 02/04/2011, 5:56 PM

## 2011-02-04 NOTE — Progress Notes (Signed)
  Order received, noted that plan is for pt to D/C to [facility] hopefully sometime over the weekend. Will defer OT eval to SNF--acute OT signing off.  If for some reason the eval needs to be completed for insurance purposes while pt at hospital you can page Ignacia Palma, OTR/L at [phone] until 4:30 today (02/04/2011) or Judithann Sauger at [phone] tomorrow 3:00 (02/05/2011).

## 2011-02-05 DIAGNOSIS — Z96649 Presence of unspecified artificial hip joint: Secondary | ICD-10-CM

## 2011-02-05 LAB — CBC
Hemoglobin: 9.6 g/dL — ABNORMAL LOW (ref 12.0–15.0)
MCH: 31.2 pg (ref 26.0–34.0)
Platelets: 241 10*3/uL (ref 150–400)
RBC: 3.08 MIL/uL — ABNORMAL LOW (ref 3.87–5.11)

## 2011-02-05 NOTE — Progress Notes (Signed)
Physical Therapy Treatment Patient Details Name: Amy Chapman MRN: [id number] DOB: 05/01/1928 Today's Date: 02/05/2011 0800 - 0838,   GT, TA, TE PT Assessment/Plan   PT Goals     PT Treatment Precautions/Restrictions  Precautions Precautions: Posterior Hip Precaution Booklet Issued: Yes (comment) Precaution Comments: Pt recalls all THP without cues Restrictions Weight Bearing Restrictions: Yes RUE Weight Bearing: Weight bearing as tolerated RLE Weight Bearing: Weight bearing as tolerated RLE Partial Weight Bearing Percentage or Pounds: 50 Other Position/Activity Restrictions: Post THP Mobility (including Balance)      Exercise    End of Session    Amy Chapman 02/05/2011, 1:24 PM

## 2011-02-05 NOTE — Progress Notes (Signed)
Amy Chapman  MRN: [id number] DOB/Age: February 21, 1929 77 y.o. Physician: Jacquelyne Balint Procedure: Procedure(s) (LRB): TOTAL HIP ARTHROPLASTY (Right)     Subjective: Up in chair. Arrangements for transfer unable to happen today, but confirmed for Monday.  Vital Signs Temp:  [98.1 F (36.7 C)-99.1 F (37.3 C)] 99.1 F (37.3 C) (11/10 0445) Pulse Rate:  [72-74] 74  (11/10 0445) Resp:  [16-18] 18  (11/10 0445) BP: (112-126)/(63-68) 123/68 mmHg (11/10 0445) SpO2:  [92 %-94 %] 94 % (11/10 0445)  Lab Results  [facility] 02/05/11 0440 02/04/11 0447  WBC 13.3* 10.3  HGB 9.6* 9.9*  HCT 28.7* 30.1*  PLT 241 214   BMET  Basename 02/04/11 0447 02/03/11 0710  NA 136 137  K 3.4* 3.8  CL 107 104  CO2 22 27  GLUCOSE 122* 114*  BUN 7 6  CREATININE 0.39* 0.46*  CALCIUM 9.1 9.0   INR  Date Value Range Status  01/28/2011 1.08  0.00-1.49 (no units) Final     Exam Right hip dressing dry. NVI bilat le         Plan DC to [facility] Monday. Continue current care through weekend   [facility] for Dr.Kevin Supple 02/05/2011, 10:11 AM

## 2011-02-05 NOTE — Progress Notes (Signed)
Notified P.A. And Pt that [facility] will be ready to receive Pt on Monday, as the D/c summary was not available by 1600 on Friday.

## 2011-02-05 NOTE — Progress Notes (Signed)
Physical Therapy Treatment Patient Details Name: Amy Chapman MRN: [id number] DOB: 1929/02/27 Today's Date: 02/05/2011 1152 - 1205  TA PT Assessment/Plan  PT - Assessment/Plan PT Plan: Discharge plan remains appropriate PT Frequency: 7X/week Follow Up Recommendations: Skilled nursing facility PT Goals  Acute Rehab PT Goals PT Goal: Sit to Supine/Side - Progress: Not met PT Goal: Stand - Progress: Not met PT Goal: Ambulate - Progress: Not met  PT Treatment Precautions/Restrictions  Precautions Precautions: Posterior Hip Precaution Booklet Issued: Yes (comment) Precaution Comments: Pt recalls all THP without cues Restrictions Weight Bearing Restrictions: Yes RUE Weight Bearing: Weight bearing as tolerated RLE Weight Bearing: Partial weight bearing RLE Partial Weight Bearing Percentage or Pounds: 25-50% Other Position/Activity Restrictions: Post THP Mobility (including Balance) Bed Mobility Sit to Supine - Right: 1: +2 Total assist Sit to Supine - Right Details (indicate cue type and reason):  (pt 60 %) Transfers Sit to Stand: 1: +2 Total assist;With upper extremity assist;With armrests;From chair/3-in-1 Sit to Stand Details (indicate cue type and reason): pt 75% Stand to Sit: 1: +2 Total assist;Patient percentage (comment);With upper extremity assist;With armrests;To bed;To chair/3-in-1 Stand to Sit Details: pt 70% with cues for use of UEs and LE position Ambulation/Gait Ambulation/Gait Assistance: 1: +2 Total assist Ambulation/Gait Assistance Details (indicate cue type and reason): pt 65% with cues for sequence and increased R LE and UE WB Ambulation Distance (Feet):  (2', 4') Assistive device: Rolling walker Gait Pattern: Step-to pattern    Exercise    End of Session PT - End of Session Activity Tolerance: Patient tolerated treatment well Patient left: in bed General Behavior During Session: [facility] for tasks performed Cognition: [facility] for tasks  performed  Yovan Leeman 02/05/2011, 2:56 PM

## 2011-02-05 NOTE — Progress Notes (Signed)
Notified Pt's daughter, Vikki Ports, of no d/c over weekend.  D/c to be on Mon.

## 2011-02-06 NOTE — Progress Notes (Signed)
Physical Therapy Treatment Patient Details Name: Amy Chapman MRN: [id number] DOB: 28-Jan-1929 Today's Date: 02/06/2011 [id number]; 1gt 1ta PT Assessment/Plan  PT - Assessment/Plan PT Plan: Discharge plan remains appropriate PT Frequency: 7X/week Follow Up Recommendations: Skilled nursing facility Equipment Recommended: Defer to next venue PT Goals  Acute Rehab PT Goals PT Goal: Stand - Progress: Progressing toward goal PT Goal: Ambulate - Progress: Progressing toward goal  PT Treatment Precautions/Restrictions  Precautions Precautions: Posterior Hip Precaution Booklet Issued: Yes (comment) Precaution Comments: pt requires cues for THP Restrictions Weight Bearing Restrictions: Yes RUE Weight Bearing: Weight bearing as tolerated RLE Weight Bearing: Partial weight bearing RLE Partial Weight Bearing Percentage or Pounds: 25-50% Other Position/Activity Restrictions: Post THP Mobility (including Balance) Bed Mobility Bed Mobility: No Transfers Transfers: Yes Sit to Stand: 3: Mod assist;From chair/3-in-1;With upper extremity assist;With armrests Sit to Stand Details (indicate cue type and reason): cues for THP, hand placement and wt shift Stand to Sit: 3: Mod assist;To chair/3-in-1;With armrests Stand to Sit Details: verbal cues for use of UEs and LE position Stand Pivot Transfers: 3: Mod assist Stand Pivot Transfer Details (indicate cue type and reason):  cues for increased WB R LE and Bil Ues, sequence, and position from RW Ambulation/Gait Ambulation/Gait: Yes Ambulation/Gait Assistance: 1: +2 Total assist Ambulation/Gait Assistance Details (indicate cue type and reason): pt=77with cues for sequence and increased R LE and UE WB Ambulation Distance (Feet): 30 Feet Assistive device: Rolling walker Gait Pattern: Step-to pattern    Exercise    End of Session PT - End of Session Activity Tolerance: Patient tolerated treatment well Patient left: in chair General Behavior  During Session: [facility] for tasks performed Cognition: [facility] for tasks performed  [facility] 02/06/2011, 11:53 AM

## 2011-02-06 NOTE — Progress Notes (Signed)
  Subjective: Procedure(s) (LRB): TOTAL HIP ARTHROPLASTY (Right) 4 Days Post-Op  Patient reports pain as 3 on 0-10 scale.  Positive void Positive bowel movement Positive flatus Negative chest pain or shortness of breath  Objective: Vital signs in last 24 hours: Temp:  [97.7 F (36.5 C)-98.7 F (37.1 C)] 97.7 F (36.5 C) (11/11 0554) Pulse Rate:  [83-90] 83  (11/11 0554) Resp:  [18] 18  (11/11 0554) BP: (109-158)/(51-83) 145/76 mmHg (11/11 0554) SpO2:  [93 %-96 %] 96 % (11/11 0554)  Intake/Output from previous day: 11/10 0701 - 11/11 0700 In: 720 [P.O.:720] Out: 1226 [Urine:1225; Stool:1]   Basename 02/05/11 0440 02/04/11 0447  WBC 13.3* 10.3  RBC 3.08* 3.24*  HCT 28.7* 30.1*  PLT 241 214    Basename 02/04/11 0447  NA 136  K 3.4*  CL 107  CO2 22  BUN 7  CREATININE 0.39*  GLUCOSE 122*  CALCIUM 9.1   No results found for this basename: LABPT:2,INR:2 in the last 72 hours  ABD soft Neurovascular intact Sensation intact distally Intact pulses distally Dorsiflexion/Plantar flexion intact Incision: dressing C/D/I Compartment soft  Assessment/Plan: Patient stable  Continue care  Plan for discharge tomorrow Discharge to SNF  Gwinda Maine 02/06/2011, 8:49 AM

## 2011-02-07 ENCOUNTER — Encounter (HOSPITAL_COMMUNITY): Payer: Self-pay | Admitting: Orthopedic Surgery

## 2011-02-07 NOTE — Discharge Summary (Signed)
Physician Discharge Summary   Patient ID: Amy Chapman MRN: [id number] DOB/AGE: 75-Jan-1930 75 y.o.  Admit date: 02/02/2011 Discharge date: 02/07/2011 Primary Diagnosis: Osteoarthritis, Hip (715.35)  Impression: Right Hip OA  Admission Diagnoses: Impaired Hearing  Depression  Coronary Artery Disease/Heart Disease  Varicose veins  Abdominal hernia (553.9)  Hypothyroidism  Osteopenia  Hypothyroidism  Myocardial infarction  Osteoarthritis, Hip    Discharge Diagnoses:  Principal Problem:  *Osteoarthritis of hip  S/P Right Total Hip  Postop Acute Blood Loss Anemia  Mild Postop Hypokalemia    Procedure: Procedure(s) (LRB): TOTAL HIP ARTHROPLASTY (Right)   Consults: none  HPI:  Amy Chapman is a 77 y.o. female with end stage arthritis of her right hip with progressively worsening pain and dysfunction. Pain occurs with activity and rest including pain at night. She has tried analgesics, protected weight bearing and rest without benefit. Pain is too severe to attempt physical therapy. Radiographs demonstrate bone on bone arthritis with subchondral cyst formation.     Laboratory Data: [facility]  02/04/11 0447  02/03/11 0710   HGB  9.9*  11.4*    Basename  02/04/11 0447  02/03/11 0710   WBC  10.3  9.4   RBC  3.24*  3.69*   HCT  30.1*  34.4*   PLT  214  238    Basename  02/04/11 0447  02/03/11 0710   NA  136  137   K  3.4*  3.8   CL  107  104   CO2  22  27   BUN  7  6   CREATININE  0.39*  0.46*   GLUCOSE  122*  114*   CALCIUM  9.1  9.0    Basename  02/05/11 0440    WBC  13.3*    RBC  3.08*    HCT  28.7*    PLT  241      Basename  02/04/11 0447   NA  136   K  3.4*   CL  107   CO2  22   BUN  7   CREATININE  0.39*   GLUCOSE  122*   CALCIUM  9.1      X-Rays:Dg Hip Complete Right  01/28/2011  *RADIOLOGY REPORT*  Clinical Data: Preoperative evaluation prior right hip arthroplasty for osteoarthritis.  RIGHT HIP - COMPLETE 2+ VIEW 01/28/2011:   Comparison: None.  Findings: Complete loss of the joint space in the right hip with hypertrophic spurring involving the femoral head and the acetabular roof.  No evidence of acute, subacute, or healed fractures. Relatively well-preserved bone mineral density for age.  Included AP pelvis demonstrates moderate inferolateral joint space narrowing in the contralateral left hip.  Sacroiliac joints and symphysis pubis intact.  Severe degenerative changes involving the visualized lumbar spine.  IMPRESSION: Severe osteoarthritis in the right hip with complete loss of the joint space and hypertrophic spurring involving the femoral head and acetabular roof.  Original Report Authenticated By: Arnell Sieving, M.D.   Dg Pelvis Portable To Be Done In Pacu  02/02/2011  *RADIOLOGY REPORT*  Clinical Data: Right hip replacement surgery.  PORTABLE PELVIS  Comparison: None  Findings: The femoral and acetabular components are well seated. No complicating features are demonstrated.  Advanced degenerative changes involving the left hip are noted.  IMPRESSION: Well seated components of a total right hip arthroplasty.  No complicating features.  Original Report Authenticated By: P. Loralie Champagne, M.D.    EKG:No orders found for this or  any previous visit.   Hospital Course:  Patient was admitted to [facility] and taken to the OR and underwent the above state procedure without complications. Patient tolerated the procedure well and was later transferred to the recovery room and then to the orthopaedic floor for postoperative care. They were given PO and IV analgesics for pain control following their surgery. They were given 24 hours of postoperative antibiotics and started on DVT prophylaxis. PT and OT were ordered for total joint protocol. Discharge planning consulted to help with postop disposition and equipment needs. Patient had a rough night on the evening of surgery and started to get up with therapy on day one.  She was feeling a little better on the next day and started to get up with PT PWB 25-50%. Encouraged PO meds and had IV push backup. Continued to progress with therapy into day two. Dressing was changed on day two and the incision was healing well. By that afternoon, it was felt that she would be ready for transfer over the weekend. We got Social Worker involved right after surgery since the patient wanted to look into SNF at [facility]. The staff informed Dr. Lequita Halt that the bed would be available on Saturday so we would make arrangements to transfer over that day. Unfortunately the facility did not accept the patient over the weekend therefore they remained in the hospital on Sat and Sun.  They continued to receive PT daily and progressed with therapy.  Seen over the weekend by the weekend coverage staff.  Seen again in rounds on Mon by Dr. Lequita Halt and ready to go over to [facility] today.    Discharge Medications: atenolol (TENORMIN) tablet 50 mg Dose: 50 mg Freq: Daily before lunch Route: PO   DULoxetine (CYMBALTA) DR capsule 60 mg Dose: 60 mg Freq: Every morning Route: PO  levothyroxine (SYNTHROID, LEVOTHROID) tablet 50 mcg Dose: 50 mcg Freq: Every morning Route: PO  olmesartan (BENICAR) tablet 40 mg Dose: 40 mg Freq: Daily before lunch Route: PO  docusate sodium (COLACE) capsule 100 mg Dose: 100 mg Freq: 2 times daily Route: PO  polysaccharide iron (NIFEREX) capsule 150 mg Dose: 150 mg Freq: Daily Route: PO for three weeks then discontinue   rivaroxaban (XARELTO) tablet 10 mg Dose: 10 mg Freq: Every 24 hours Route: PO for 18 more days then discontinue  acetaminophen (TYLENOL) tablet 650 mg Dose: 650 mg Freq: Every 6 hours PRN Route: PO PRN Reason: mild pain PRN Comment: or Fever >/= 101  bisacodyl (DULCOLAX) EC tablet 10 mg Dose: 10 mg Freq: Daily PRN Route: PO PRN Reason: constipation  bisacodyl (DULCOLAX) suppository 10 mg Dose: 10 mg Freq: Daily PRN Route: RE PRN Reason:  constipation PRN Comment: constipation  diphenhydrAMINE (BENADRYL) 12.5 MG/5ML elixir 12.5-25 mg Dose: 12.5-25 mg Freq: Every 4 hours PRN Route: PO PRN Reason: itching  magnesium hydroxide (MILK OF MAGNESIA) suspension 30 mL Dose: 30 mL Freq: Every 12 hours PRN Route: PO PRN Reason: constipation  methocarbamol (ROBAXIN) tablet 500 mg Dose: 500 mg Freq: Every 6 hours PRN Route: PO PRN Reason: muscle spasms  metoCLOPramide (REGLAN) tablet 5-10 mg Dose: 5-10 mg Freq: Every 8 hours PRN Route: PO PRN Reason: nausea  ondansetron (ZOFRAN) tablet 4 mg Dose: 4 mg Freq: Every 6 hours PRN Route: PO PRN Reason: nausea   oxyCODONE (Oxy IR/ROXICODONE) immediate release tablet 5-10 mg Dose: 5-10 mg Freq: Every 4 hours PRN Route: PO PRN Reason: breakthrough pain  polyethylene glycol (MIRALAX / GLYCOLAX) packet  17 g Dose: 17 g Freq: Daily PRN Route: PO PRN Reason: constipation  temazepam (RESTORIL) capsule 15-30 mg Dose: 15-30 mg Freq: At bedtime PRN Route: PO PRN Reason: sleep   Diet: Diabetic  Activity:PWB   Follow-up:in 2 weeks following surgery  Disposition: [facility] at [facility]  Discharged Condition: good   Discharge Orders    Future Orders Please Complete By Expires   Diet - low sodium heart healthy      Increase activity slowly as tolerated      Weight Bearing as taught in Physical Therapy      Scheduling Instructions:   Partial Weight Bearing 25-50% Only to the Right Leg   Comments:   Use a walker or crutches as instructed.   Discharge instructions      Comments:    Do not submerge incision under water. May shower. Hip precautions.  Total Hip Protocol.   Driving restrictions      Comments:   No driving   Lifting restrictions      Comments:   No lifting   Follow the hip precautions as taught in Physical Therapy      Change dressing      Comments:   You may change your dressing daily with sterile 4 x 4 inch gauze dressing and paper tape.   TED hose       Comments:   Use stockings (TED hose) for 2 weeks on both leg(s).  You may remove them at night for sleeping.     Current Discharge Medication List    START taking these medications   Details  methocarbamol (ROBAXIN) 500 MG tablet Take 1 tablet (500 mg total) by mouth every 6 (six) hours as needed. Qty: 80 tablet, Refills: 0    oxyCODONE (OXY IR/ROXICODONE) 5 MG immediate release tablet Take 1-2 tablets (5-10 mg total) by mouth every 4 (four) hours as needed for pain. Qty: 80 tablet, Refills: 0      CONTINUE these medications which have NOT CHANGED   Details  atenolol (TENORMIN) 50 MG tablet Take 50 mg by mouth daily before lunch.     DULoxetine (CYMBALTA) 60 MG capsule Take 60 mg by mouth every morning.     levothyroxine (SYNTHROID, LEVOTHROID) 50 MCG tablet Take 50 mcg by mouth every morning.     olmesartan (BENICAR) 40 MG tablet Take 40 mg by mouth daily before lunch.       STOP taking these medications     aspirin 81 MG tablet      folic acid (FOLVITE) 400 MCG tablet      naproxen sodium (ANAPROX) 220 MG tablet      vitamin B-12 (CYANOCOBALAMIN) 500 MCG tablet      Vitamin D, Ergocalciferol, (DRISDOL) 50000 UNITS CAPS          Signed: [name] 02/07/2011, 7:18 AM

## 2011-02-07 NOTE — Progress Notes (Signed)
Patient for d/c today to SNF bed at [facility]. Patient is very positive and optimistic about her rehab- "I haven't walked more than a few steps in 9 months!".  Patient and family agreeable- plan transfer via EMS after lunch.  Reece Levy, MSW, [facility]ors [phone]

## 2011-03-24 ENCOUNTER — Telehealth: Payer: Self-pay | Admitting: *Deleted

## 2011-03-24 NOTE — Telephone Encounter (Signed)
Entered in error

## 2012-06-09 IMAGING — CR DG HIP COMPLETE 2+V*R*
3 series · 3 of 3 positions shown · non-contrast
Comparison: None.

CLINICAL DATA: Preoperative evaluation prior right hip arthroplasty
for osteoarthritis.

RIGHT HIP - COMPLETE 2+ VIEW 01/28/2011:

[t hip frog leg right]
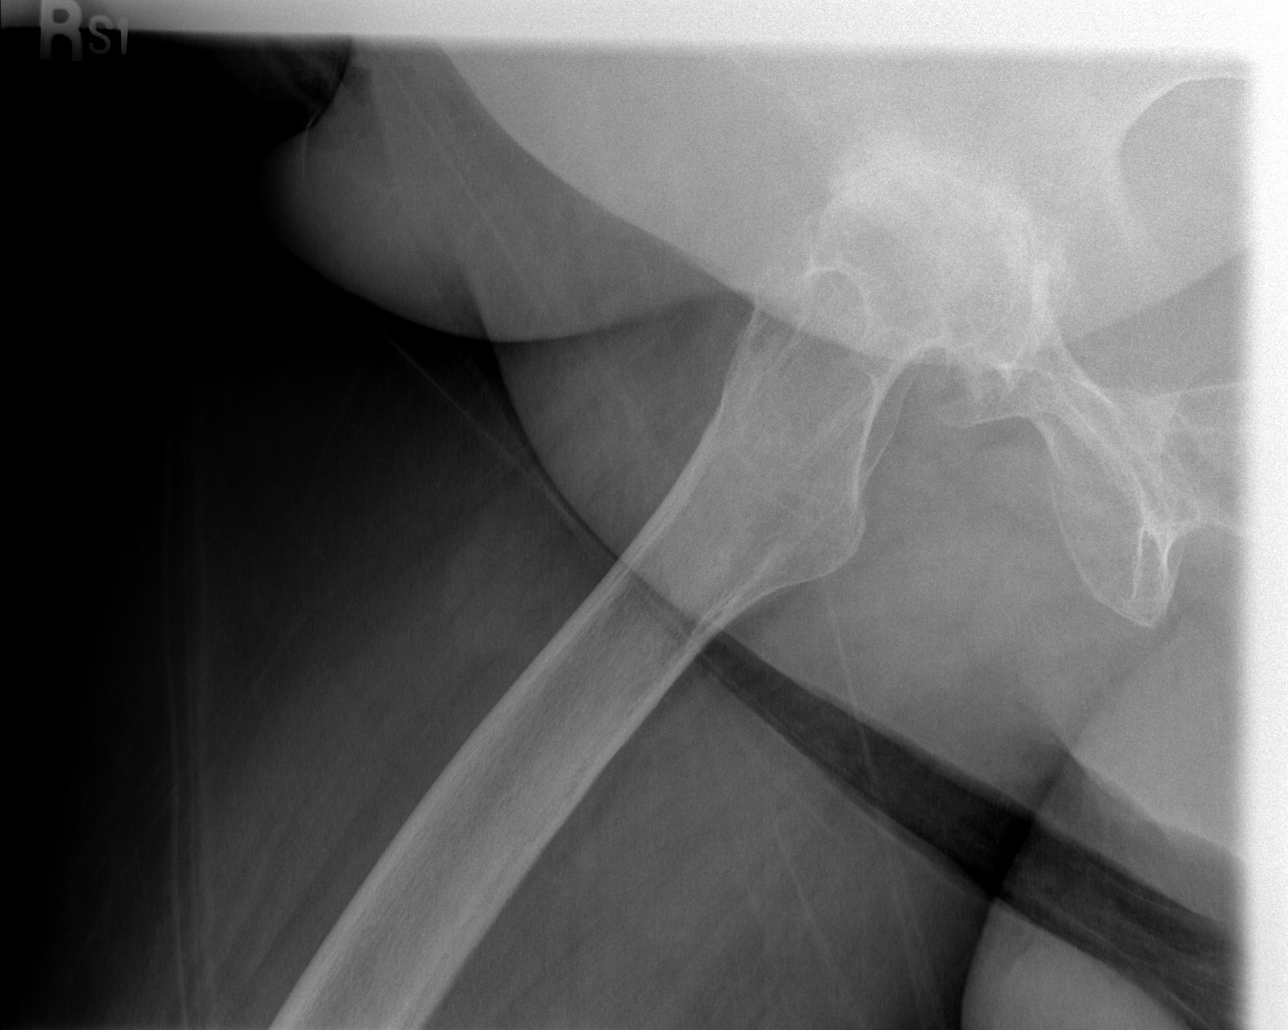

[t hip ap right]
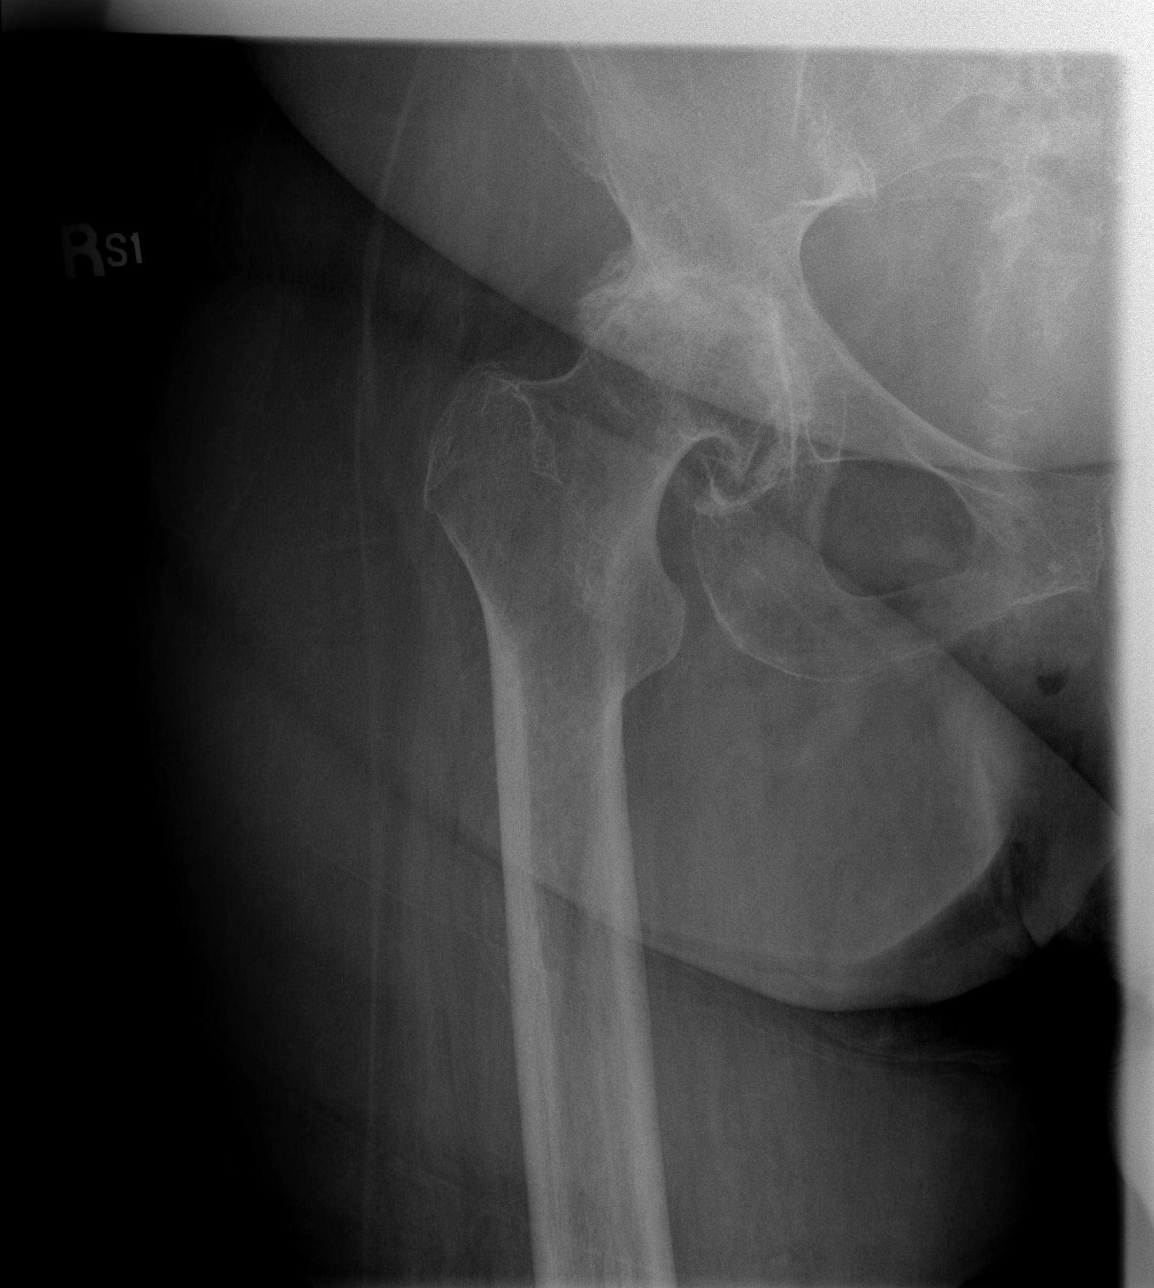

[t pelvis a.p.]
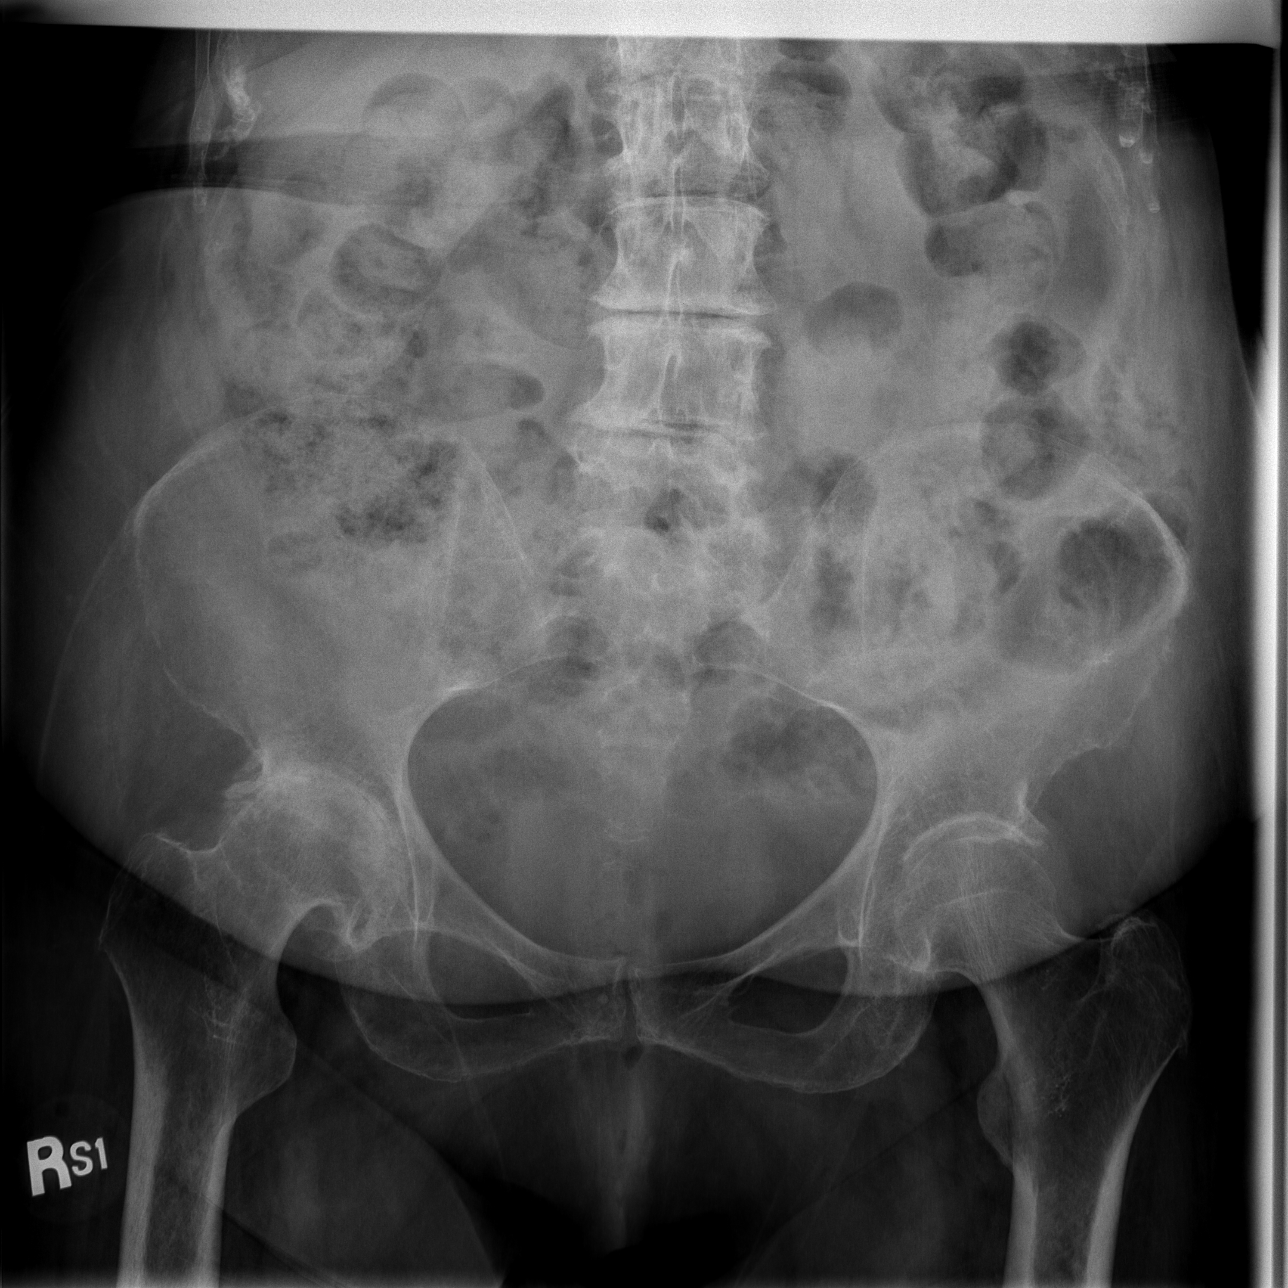

[3 of 3 positions shown; findings below may reference images not displayed]

FINDINGS: Complete loss of the joint space in the right hip with
hypertrophic spurring involving the femoral head and the acetabular
roof.  No evidence of acute, subacute, or healed fractures.
Relatively well-preserved bone mineral density for age.

Included AP pelvis demonstrates moderate inferolateral joint space
narrowing in the contralateral left hip.  Sacroiliac joints and
symphysis pubis intact.  Severe degenerative changes involving the
visualized lumbar spine.
IMPRESSION: Severe osteoarthritis in the right hip with complete loss of the
joint space and hypertrophic spurring involving the femoral head
and acetabular roof.

## 2012-06-14 IMAGING — CR DG PORTABLE PELVIS
1 series · 2 of 2 positions shown · non-contrast
Comparison: None

CLINICAL DATA: Right hip replacement surgery.

PORTABLE PELVIS

[Series 1: AP · U · 2 of 2 slices shown]
[im 1/2]
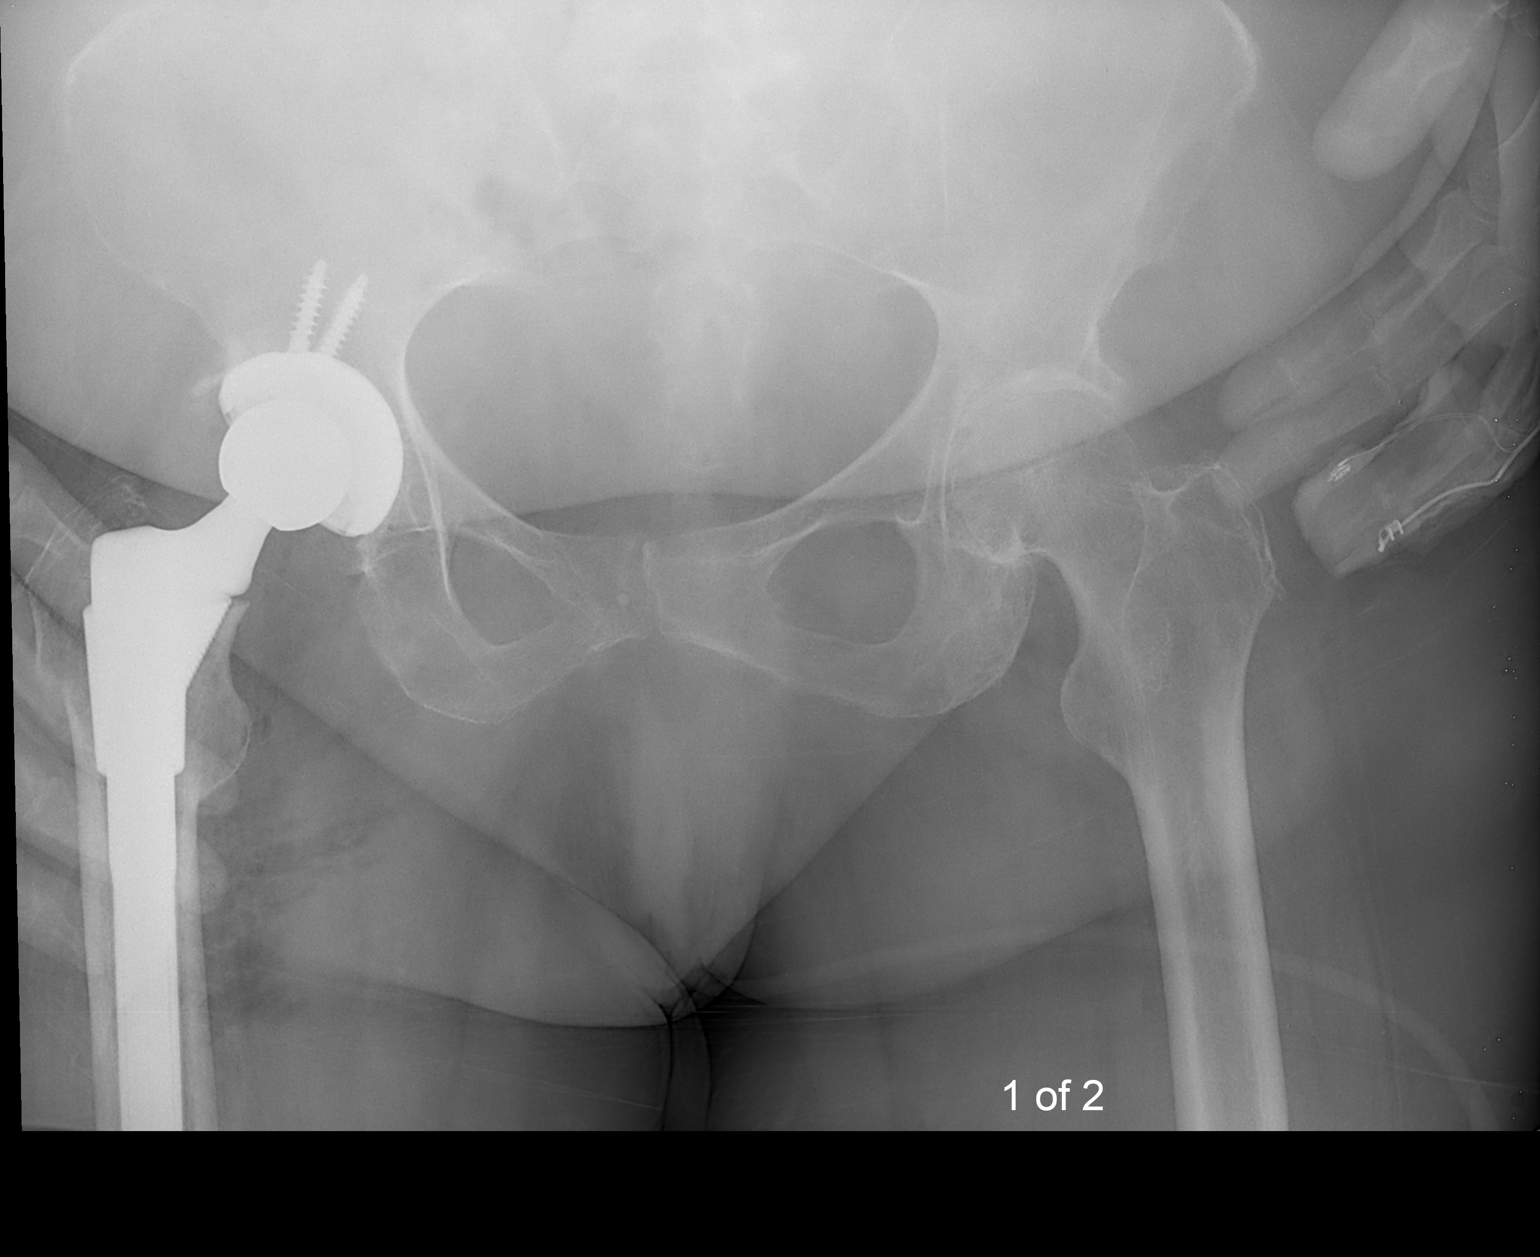
[im 2/2]
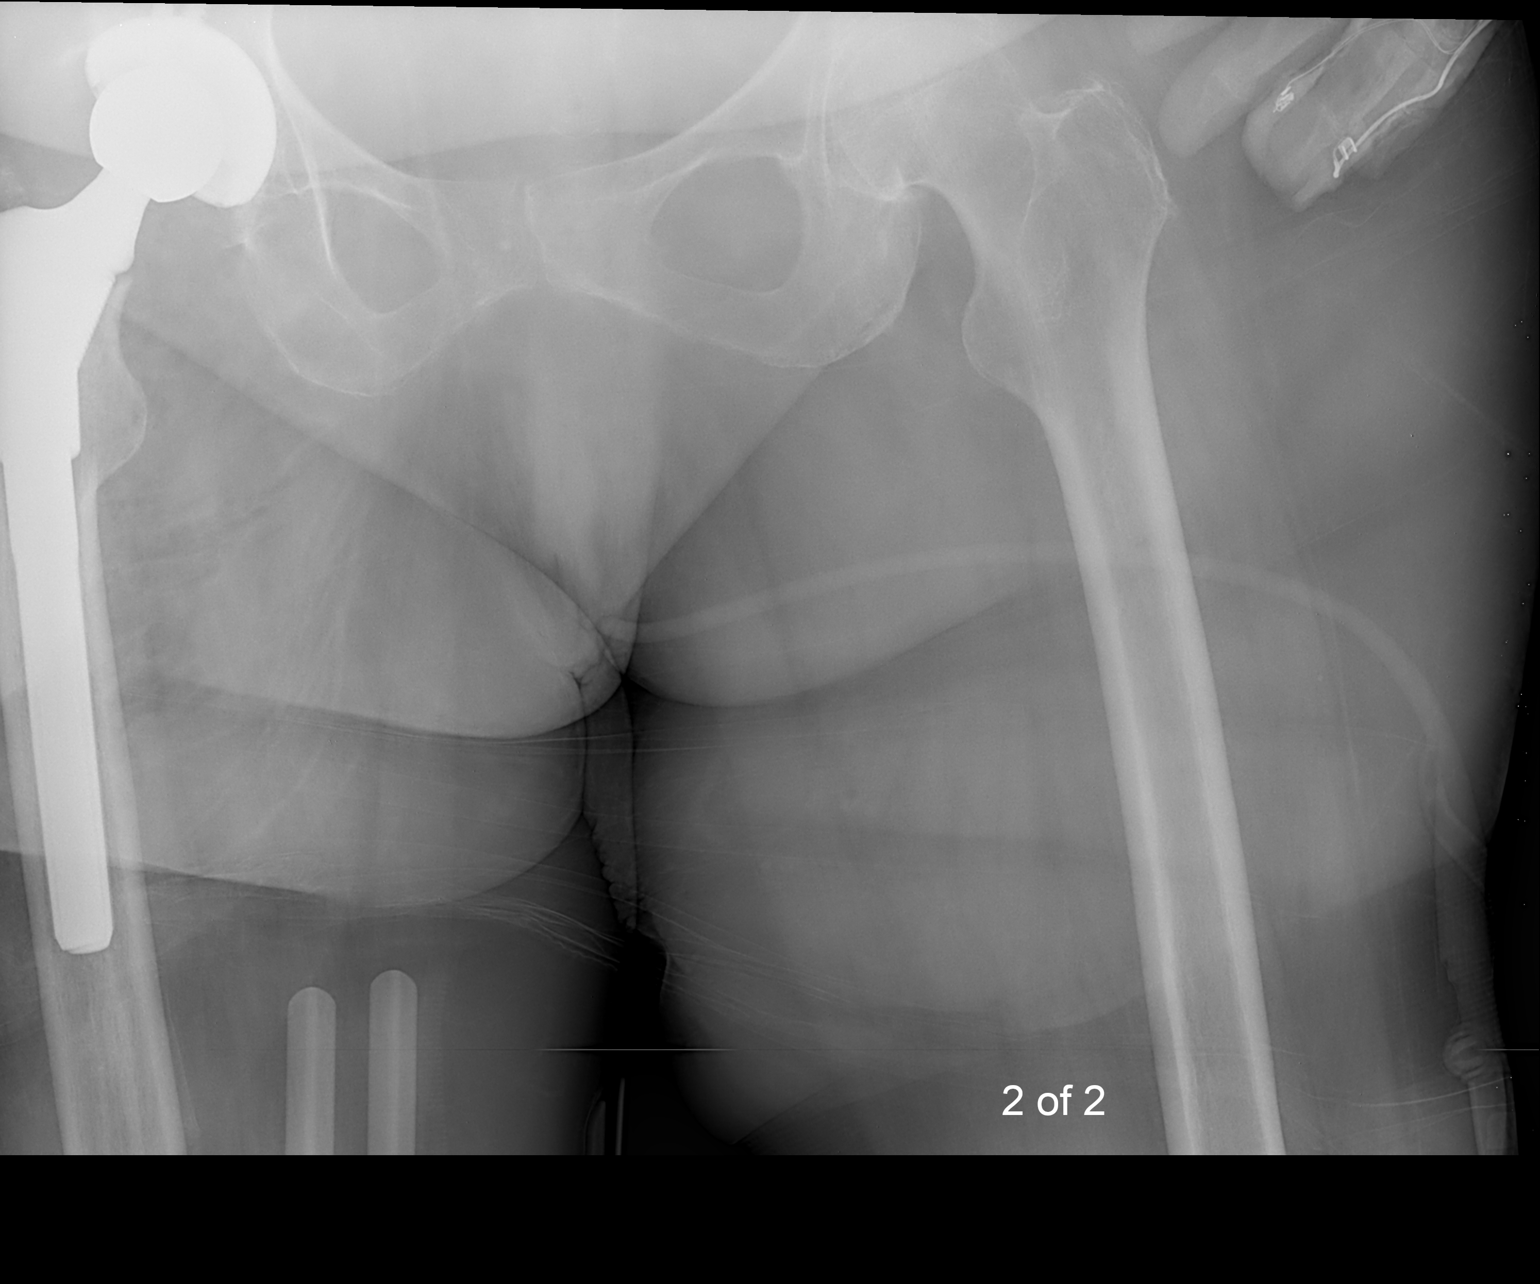

[2 of 2 positions shown; findings below may reference images not displayed]

FINDINGS: The femoral and acetabular components are well seated.
No complicating features are demonstrated.  Advanced degenerative
changes involving the left hip are noted.
IMPRESSION: Well seated components of a total right hip arthroplasty.  No
complicating features.

## 2015-11-25 ENCOUNTER — Encounter: Payer: Self-pay | Admitting: Cardiovascular Disease

## 2015-11-25 ENCOUNTER — Encounter (INDEPENDENT_AMBULATORY_CARE_PROVIDER_SITE_OTHER): Payer: Self-pay

## 2015-11-25 ENCOUNTER — Ambulatory Visit (INDEPENDENT_AMBULATORY_CARE_PROVIDER_SITE_OTHER): Payer: Medicare Other | Admitting: Cardiovascular Disease

## 2015-11-25 VITALS — BP 126/74 | HR 82 | Ht 60.0 in | Wt 182.0 lb

## 2015-11-25 DIAGNOSIS — I10 Essential (primary) hypertension: Secondary | ICD-10-CM | POA: Diagnosis not present

## 2015-11-25 DIAGNOSIS — Z79899 Other long term (current) drug therapy: Secondary | ICD-10-CM | POA: Diagnosis not present

## 2015-11-25 DIAGNOSIS — I481 Persistent atrial fibrillation: Secondary | ICD-10-CM | POA: Diagnosis not present

## 2015-11-25 DIAGNOSIS — I5032 Chronic diastolic (congestive) heart failure: Secondary | ICD-10-CM | POA: Diagnosis not present

## 2015-11-25 DIAGNOSIS — I4819 Other persistent atrial fibrillation: Secondary | ICD-10-CM

## 2015-11-25 DIAGNOSIS — Z8679 Personal history of other diseases of the circulatory system: Secondary | ICD-10-CM

## 2015-11-25 MED ORDER — DILTIAZEM HCL ER COATED BEADS 120 MG PO CP24: 240.0000 mg | ORAL_CAPSULE | Freq: Every day | ORAL | 11 refills | Status: DC

## 2015-11-25 MED ORDER — FUROSEMIDE 40 MG PO TABS: 40.0000 mg | ORAL_TABLET | Freq: Every day | ORAL | 11 refills | Status: DC

## 2015-11-25 MED ORDER — METOPROLOL TARTRATE 50 MG PO TABS: 50.0000 mg | ORAL_TABLET | Freq: Two times a day (BID) | ORAL | 11 refills | Status: AC

## 2015-11-25 MED ORDER — APIXABAN 5 MG PO TABS: 5.0000 mg | ORAL_TABLET | Freq: Two times a day (BID) | ORAL | 11 refills | Status: DC

## 2015-11-25 MED ORDER — METOPROLOL TARTRATE 50 MG PO TABS: 50.0000 mg | ORAL_TABLET | Freq: Two times a day (BID) | ORAL | 11 refills | Status: DC

## 2015-11-25 MED ORDER — POTASSIUM CHLORIDE CRYS ER 20 MEQ PO TBCR: 20.0000 meq | EXTENDED_RELEASE_TABLET | Freq: Every day | ORAL | 11 refills | Status: DC

## 2015-11-25 MED ORDER — POTASSIUM CHLORIDE CRYS ER 20 MEQ PO TBCR: 20.0000 meq | EXTENDED_RELEASE_TABLET | Freq: Every day | ORAL | 11 refills | Status: AC

## 2015-11-25 MED ORDER — APIXABAN 5 MG PO TABS: 5.0000 mg | ORAL_TABLET | Freq: Two times a day (BID) | ORAL | 11 refills | Status: AC

## 2015-11-25 NOTE — Patient Instructions (Signed)
Medication Instructions: Dr Royann Shiversroitoru has recommended making the following medication changes: 1. STOP Valsartan 2. INCREASE Metoprolol to 50 mg twice daily 3. DECREASE Diltiazem to 120 mg daily 4. START Furosemide 40 mg - take 1 tablet by mouth daily 5. START Potassium 20 mEq - take 1 tablet by mouth daily 6. RESTART Eliquis 5 mg - take 1 tablet by twice daily  Labwork: Your physician recommends that you return for lab work in 2 weeks.  Testing/Procedures: NONE ORDERED  Follow-up: Dr Royann Shiversroitoru recommends that you schedule a follow-up appointment first available.  If you need a refill on your cardiac medications before your next appointment, please call your pharmacy.

## 2015-11-25 NOTE — Progress Notes (Signed)
Cardiology Office Note    Date:  11/26/2015   ID:  Amy Chapman, DOB 1928-04-09, MRN [id number]  PCP:  Darryl Lent, PA-C  Cardiologist:   Thurmon Fair, MD   Chief Complaint  Patient presents with  . New Evaluation    sob,     History of Present Illness:  Amy Chapman is a 82 y.o. female who I'm seeing for the first time. She is accompanied by her daughter who is an oncology nurse. His Bibian is very hard of hearing, but appears to be alert and oriented.  Reportedly, roughly 20 years ago in [place] she was diagnosed with coronary disease and had a total blockage of the LAD arteries with good filling via collaterals. She does not have angina and according to her daughter her ejection fraction was 80%. This was an incidental finding during preoperative evaluation for hernia surgery. She has subsequently had 3 hernia repair surgeries without cardiac complications.  In May of this year she developed shortness of breath and cough and was hospitalized at [facility]. She was diagnosed with atrial fibrillation and started on liquids and was treated with antibiotics for pneumonia. After discharge was readmitted with another episode of pneumonia. She was discharged to a rehabilitation facility in [facility] where she gradually weaned off oxygen and was doing best when her weight was down 267 pounds. She has gradually gained fluid back, now 15 pounds heavier and with prominent bilateral leg edema. Her anticoagulant was interrupted when she developed some blood in her underwear. For why she was worked up for a gynecological source of bleeding, but that workup was negative. It appears that she has mild rectal bleeding related to a rectocele. She has now been off anticoagulants for roughly 1 month. The bleeding was never severe and she never dropped her hemoglobin. The patient believes that the Eliquis caused a rash, but her daughter states that the rash resolved spontaneously and  does not believe it was related to that particular medication. She was treated with steroids for a while for the rash. She is still in residence at [facility] nursing facility in [place].  She has never smoked but has a reported diagnosis of COPD due to long-standing secondhand smoke exposure. She bears a diagnosis of hypertension. She does not have diabetes mellitus and reportedly has not had problems with cholesterol.  Although complete records of her hospitalization are not available, multiple studies can be reviewed in " care everywhere".  - May 2017 CT of the chest demonstrated cardiomegaly, infiltrates with air bronchograms "multifocal pneumonia", mild atherosclerotic calcifications of the thoracic aorta (no mention is made of coronary calcifications).  - 07/21/2015 echo shows mild concentric LVH, normal left ventricular systolic function without regional abnormalities, mild eccentric posteriorly directed mitral regurgitation, biatrial enlargement. - ECGs from April and May show atrial fibrillation and right bundle branch block, Sybil inferior MI - Most recent labs from August 28, 2015 show a creatinine of 0.44, potassium 3.5, hemoglobin 12.4, Hemoccult positive, multiple assays with normal cardiac enzymes, borderline elevated TSH 5.1 - Lipid profile July 22, 2015 shows total cholesterol 108, Feliz Beam was 94, HDL 37, LDL 52  She was elevated by Dr. Desma Maxim on May 18. At time he consolidated a discharge prescription for diltiazem 60 mg 4 times daily to diltiazem CD 240 mg daily (in addition to the metoprolol 25 mg twice a day that she was taking).  Past Medical History:  Diagnosis Date  . 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 Coronary artery disease    hx of blockage-but her system re-vasculized and no stent - per hearrt cath 12 yrs ago  . Depression    w/c bound for past yr--cymbalta helping and also helps leg pain  . Hypothyroidism   . Myocardial  infarction (HCC)    no idea--per muga scan yrs ago    Past Surgical History:  Procedure Laterality Date  . APPENDECTOMY  1940's  . EYE SURGERY     bilateral cataract extractions 2007  . FRACTURE SURGERY     right ankle-has pin  . HERNIA REPAIR     hernia repairs x 3 afteer [facility] 1998  . INNER EAR SURGERY     left ear 1970's  . TONSILLECTOMY  1988  . TOTAL HIP ARTHROPLASTY  02/02/2011   Procedure: TOTAL HIP ARTHROPLASTY;  Surgeon: Gus Rankin Aluisio;  Location: [facility];  Service: Orthopedics;  Laterality: Right;    Current Medications: Outpatient Medications Prior to Visit  Medication Sig Dispense Refill  . levothyroxine (SYNTHROID, LEVOTHROID) 50 MCG tablet Take 50 mcg by mouth every morning.     Marland Kitchen atenolol (TENORMIN) 50 MG tablet Take 50 mg by mouth daily before lunch.     . DULoxetine (CYMBALTA) 60 MG capsule Take 60 mg by mouth every morning.     . olmesartan (BENICAR) 40 MG tablet Take 40 mg by mouth daily before lunch.      No facility-administered medications prior to visit.      Allergies:   Thiopental and Pentothal [thiopental sodium]   Social History   Social History  . Marital status: Widowed    Spouse name: N/A  . Number of children: N/A  . Years of education: N/A   Social History Main Topics  . Smoking status: Never Smoker  . Smokeless tobacco: Never Used  . Alcohol use No  . Drug use: No  . Sexual activity: Not Asked   Other Topics Concern  . None   Social History Narrative  . None     Family History:  The patient's Family history is reviewed significantly negative for coronary artery disease or congestive heart failure  ROS:   Please see the history of present illness.    ROS All other systems reviewed and are negative.   PHYSICAL EXAM:   VS:  BP 126/74 (BP Location: Right Arm, Patient Position: Sitting, Cuff Size: Normal)   Pulse 82   Ht 5' (1.524 m)   Wt 182 lb (82.6 kg)   SpO2 97%   BMI 35.54 kg/m    GEN: Well nourished, well developed,  in no acute distress . She appears fully alert and oriented but is extremely hard of hearing. She smiles a lot but clearly does not understand all the questions asked HEENT: normal  Neck: 8 cm elevated JVD sitting upright, no carotid bruits or masses Cardiac: irregular, widely split second heart sound, no murmurs, rubs, or gallops, 3+ symmetrical pitting bilateral edema Roughly to the knees  Respiratory: Highly diminished breath sounds in the bases, but otherwise  clear to auscultation bilaterally, normal work of breathing GI: soft, nontender, nondistended, + BS MS: no deformity or atrophy  Skin: warm and dry, no rash Neuro:  Alert and Oriented x 3, Strength and sensation are intact. Severely hard of hearing  Psych: euthymic mood, full affect  Wt Readings from Last 3 Encounters:  11/25/15 182 lb (82.6 kg)  02/03/11 198 lb (89.8 kg)  01/28/11  198 lb (89.8 kg)      Studies/Labs Reviewed:   EKG:  EKG is ordered today.  The ekg ordered today demonstrates atrial fibrillation and right bundle branch block  Recent Labs: - Most recent labs from August 28, 2015 show a creatinine of 0.44, potassium 3.5, hemoglobin 12.4, Hemoccult positive, multiple assays with normal cardiac enzymes, borderline elevated TSH 5.1 - Lipid profile July 22, 2015 shows total cholesterol 108, Feliz Beamravis was 94, HDL 37, LDL 52 Additional studies/ records that were reviewed today include:  All records available in care everywhere   ASSESSMENT:    1. Persistent atrial fibrillation (HCC)   2. Chronic diastolic heart failure (HCC)   3. Essential hypertension   4. History of coronary artery disease   5. Medication management      PLAN:  In order of problems listed above:  1. AFib: This is a long-term persistent abnormality and it is quite possible that she has permanent atrial fibrillation. Rate control is appropriate. I don't think there is any convincing evidence that she would benefit from cardioversion. She should  restart anticoagulation since she has high risk for embolic events.CHADSVasc at least 6 (age 71, HTN, CHF, gender, CAD). 2. Diastolic HF:  appears to be the most likely explanation for her edema, although the diltiazem is probably contributing to the edema. We'll try to reduce the dose of diltiazem and gradually use beta blockers only for rate control. Also increase the diuretic and potassium supplement. 3. HTN: Her blood pressure control is excellent. I don't see a strong reason to continue the angiotensin receptor blocker and her medical regimen is fairly complicated. Will discontinue it. 4. CAD: Her daughter gives a fairly convincing description of what sounds like chronic total occlusion of the LAD artery with collateral formation, but the patient does not have angina pectoris, has normal LV regional wall motion abnormalities on echo, there is not a lot of coronary artery calcification per her CT report and there is no convincing evidence of active ischemic heart disease.  I realize that multiple changes were made to her medications today and she requires close follow-up. I am heartened by the fact that she is in a nursing facility and we should be able to quickly pick up side effects. Will check electrolytes and renal function in a couple of weeks and bring her back to the office fairly quickly.  Medication Adjustments/Labs and Tests Ordered: Current medicines are reviewed at length with the patient today.  Concerns regarding medicines are outlined above.  Medication changes, Labs and Tests ordered today are listed in the Patient Instructions below. Patient Instructions  Medication Instructions: Dr Royann Shiversroitoru has recommended making the following medication changes: 1. STOP Valsartan 2. INCREASE Metoprolol to 50 mg twice daily 3. DECREASE Diltiazem to 120 mg daily 4. START Furosemide 40 mg - take 1 tablet by mouth daily 5. START Potassium 20 mEq - take 1 tablet by mouth daily 6. RESTART Eliquis 5  mg - take 1 tablet by twice daily  Labwork: Your physician recommends that you return for lab work in 2 weeks.  Testing/Procedures: NONE ORDERED  Follow-up: Dr Royann Shiversroitoru recommends that you schedule a follow-up appointment first available.  If you need a refill on your cardiac medications before your next appointment, please call your pharmacy.    Signed, Thurmon FairMihai Hillary Struss, MD  11/26/2015 3:49 PM    [facility] [address], [place], [place]  [address] Phone: [phone]; Fax: [phone]

## 2015-11-26 ENCOUNTER — Other Ambulatory Visit: Payer: Self-pay | Admitting: Cardiovascular Disease

## 2015-11-26 ENCOUNTER — Telehealth: Payer: Self-pay | Admitting: Cardiovascular Disease

## 2015-11-26 ENCOUNTER — Encounter: Payer: Self-pay | Admitting: Cardiovascular Disease

## 2015-11-26 DIAGNOSIS — I5032 Chronic diastolic (congestive) heart failure: Secondary | ICD-10-CM

## 2015-11-26 DIAGNOSIS — I10 Essential (primary) hypertension: Secondary | ICD-10-CM

## 2015-11-26 DIAGNOSIS — Z8679 Personal history of other diseases of the circulatory system: Secondary | ICD-10-CM

## 2015-11-26 DIAGNOSIS — I4819 Other persistent atrial fibrillation: Secondary | ICD-10-CM

## 2015-11-26 HISTORY — DX: Essential (primary) hypertension: I10

## 2015-11-26 HISTORY — DX: Chronic diastolic (congestive) heart failure: I50.32

## 2015-11-26 HISTORY — DX: Other persistent atrial fibrillation: I48.19

## 2015-11-26 HISTORY — DX: Personal history of other diseases of the circulatory system: Z86.79

## 2015-11-26 MED ORDER — FUROSEMIDE 80 MG PO TABS: 40.0000 mg | ORAL_TABLET | Freq: Every day | ORAL | 11 refills | Status: AC

## 2015-11-26 NOTE — Telephone Encounter (Signed)
Furosemide 20mg  & 40mg  tablets are on backorder per [facility] Rx(s) sent to pharmacy electronically for lasix 80mg  w/instructions to take 1/2 tab PO QD

## 2015-11-26 NOTE — Telephone Encounter (Signed)
[name]-wanting to clarify change in medication that was made yesterday.  Per AVS- pt is suppose to decrease Cardizem to 120mg  daily but new rx was sent with pt that is for Cardizem 120mg  BID.  Unable to clarify from OV note-will route to MD for clarification.  Routed to MD Croitoru.

## 2015-11-26 NOTE — Telephone Encounter (Signed)
That is correct, need to reduce the diltiazem to 120 mg ONCE daily

## 2015-11-26 NOTE — Telephone Encounter (Signed)
Contacted [name] at [name]-aware of MD Chapman clarification:  Amy FairMihai Croitoru, MD   11/26/15 12:11 PM  Note    That is correct, need to reduce the diltiazem to 120 mg ONCE daily      Verbalized understanding.  Med list updated.

## 2015-11-26 NOTE — Telephone Encounter (Signed)
New message        Pt was seen yesterday.  Calling to verify medication orders.

## 2015-12-27 DEATH — deceased

## 2016-01-01 ENCOUNTER — Ambulatory Visit: Payer: Medicare Other | Admitting: Cardiovascular Disease

## 2016-01-01 ENCOUNTER — Telehealth: Payer: Self-pay | Admitting: Cardiovascular Disease

## 2016-01-01 NOTE — Telephone Encounter (Signed)
New Message    FYI   Pts dtr called to state that the pt has passed away.

## 2016-01-06 ENCOUNTER — Ambulatory Visit: Payer: Medicaid Other | Admitting: Cardiovascular Disease

## 2016-02-09 ENCOUNTER — Other Ambulatory Visit: Payer: Self-pay | Admitting: *Deleted
